# Patient Record
Sex: Female | Born: 1985 | Race: Black or African American | Hispanic: No | Marital: Single | Smoking: Former smoker
Health system: Southern US, Community
[De-identification: ages and names within clinical notes are randomized; demographics above are authoritative.]

## PROBLEM LIST (undated history)

## (undated) DIAGNOSIS — N750 Cyst of Bartholin's gland: Secondary | ICD-10-CM

## (undated) DIAGNOSIS — A599 Trichomoniasis, unspecified: Secondary | ICD-10-CM

## (undated) DIAGNOSIS — B9689 Other specified bacterial agents as the cause of diseases classified elsewhere: Secondary | ICD-10-CM

## (undated) DIAGNOSIS — D759 Disease of blood and blood-forming organs, unspecified: Secondary | ICD-10-CM

## (undated) DIAGNOSIS — J45909 Unspecified asthma, uncomplicated: Secondary | ICD-10-CM

## (undated) DIAGNOSIS — B009 Herpesviral infection, unspecified: Secondary | ICD-10-CM

## (undated) DIAGNOSIS — N760 Acute vaginitis: Secondary | ICD-10-CM

## (undated) DIAGNOSIS — N189 Chronic kidney disease, unspecified: Secondary | ICD-10-CM

## (undated) DIAGNOSIS — J069 Acute upper respiratory infection, unspecified: Secondary | ICD-10-CM

## (undated) DIAGNOSIS — D649 Anemia, unspecified: Secondary | ICD-10-CM

## (undated) HISTORY — DX: Disease of blood and blood-forming organs, unspecified: D75.9

## (undated) HISTORY — PX: INDUCED ABORTION: SHX677

## (undated) HISTORY — DX: Acute upper respiratory infection, unspecified: J06.9

## (undated) HISTORY — DX: Chronic kidney disease, unspecified: N18.9

## (undated) HISTORY — DX: Anemia, unspecified: D64.9

## (undated) HISTORY — PX: NO PAST SURGERIES: SHX2092

---

## 2009-09-22 ENCOUNTER — Ambulatory Visit (HOSPITAL_COMMUNITY): Admission: AD | Admit: 2009-09-22 | Discharge: 2009-09-22 | Payer: Self-pay | Admitting: Obstetrics

## 2009-11-28 ENCOUNTER — Inpatient Hospital Stay (HOSPITAL_COMMUNITY): Admission: AD | Admit: 2009-11-28 | Discharge: 2009-12-01 | Payer: Self-pay | Admitting: Obstetrics

## 2009-11-28 ENCOUNTER — Ambulatory Visit: Payer: Self-pay | Admitting: Obstetrics & Gynecology

## 2010-06-24 LAB — CBC
HCT: 27.8 % — ABNORMAL LOW (ref 36.0–46.0)
Hemoglobin: 9.2 g/dL — ABNORMAL LOW (ref 12.0–15.0)
MCH: 26.7 pg (ref 26.0–34.0)
MCH: 27.8 pg (ref 26.0–34.0)
MCHC: 32 g/dL (ref 30.0–36.0)
MCHC: 33 g/dL (ref 30.0–36.0)
MCV: 83.5 fL (ref 78.0–100.0)
Platelets: 196 10*3/uL (ref 150–400)
RBC: 4.6 MIL/uL (ref 3.87–5.11)
RDW: 15.4 % (ref 11.5–15.5)

## 2010-06-24 LAB — RH IMMUNE GLOB WKUP(>/=20WKS)(NOT WOMEN'S HOSP)

## 2010-06-24 LAB — RPR: RPR Ser Ql: NONREACTIVE

## 2010-06-26 LAB — RH IMMUNE GLOBULIN WORKUP (NOT WOMEN'S HOSP): Antibody Screen: NEGATIVE

## 2011-01-09 ENCOUNTER — Inpatient Hospital Stay (HOSPITAL_COMMUNITY)
Admission: AD | Admit: 2011-01-09 | Discharge: 2011-01-09 | Disposition: A | Discharge status: Home / Self Care | Payer: Self-pay | Source: Ambulatory Visit | Attending: Obstetrics and Gynecology | Admitting: Obstetrics and Gynecology

## 2011-01-09 ENCOUNTER — Inpatient Hospital Stay (HOSPITAL_COMMUNITY): Payer: Self-pay

## 2011-01-09 ENCOUNTER — Encounter (HOSPITAL_COMMUNITY): Payer: Self-pay | Admitting: *Deleted

## 2011-01-09 DIAGNOSIS — O99891 Other specified diseases and conditions complicating pregnancy: Secondary | ICD-10-CM | POA: Insufficient documentation

## 2011-01-09 DIAGNOSIS — J069 Acute upper respiratory infection, unspecified: Secondary | ICD-10-CM | POA: Insufficient documentation

## 2011-01-09 DIAGNOSIS — R062 Wheezing: Secondary | ICD-10-CM | POA: Insufficient documentation

## 2011-01-09 HISTORY — DX: Cyst of Bartholin's gland: N75.0

## 2011-01-09 HISTORY — DX: Trichomoniasis, unspecified: A59.9

## 2011-01-09 LAB — URINALYSIS, ROUTINE W REFLEX MICROSCOPIC
Bilirubin Urine: NEGATIVE
Glucose, UA: NEGATIVE mg/dL
Ketones, ur: 40 mg/dL — AB
Protein, ur: NEGATIVE mg/dL
pH: 6 (ref 5.0–8.0)

## 2011-01-09 LAB — CBC
HCT: 37 % (ref 36.0–46.0)
Hemoglobin: 12.3 g/dL (ref 12.0–15.0)
MCH: 28 pg (ref 26.0–34.0)
MCHC: 33.2 g/dL (ref 30.0–36.0)
MCV: 84.1 fL (ref 78.0–100.0)

## 2011-01-09 LAB — URINE MICROSCOPIC-ADD ON

## 2011-01-09 MED ORDER — ALBUTEROL SULFATE (5 MG/ML) 0.5% IN NEBU
INHALATION_SOLUTION | RESPIRATORY_TRACT | Status: AC
  Filled 2011-01-09: qty 0.5

## 2011-01-09 MED ORDER — ALBUTEROL SULFATE (5 MG/ML) 0.5% IN NEBU: 2.5000 mg | INHALATION_SOLUTION | Freq: Once | RESPIRATORY_TRACT | Status: DC

## 2011-01-09 MED ORDER — IPRATROPIUM BROMIDE 0.02 % IN SOLN
RESPIRATORY_TRACT | Status: AC
  Filled 2011-01-09: qty 2.5

## 2011-01-09 MED ORDER — AZITHROMYCIN 250 MG PO TABS: ORAL_TABLET | ORAL | Status: AC

## 2011-01-09 MED ORDER — IPRATROPIUM BROMIDE 0.02 % IN SOLN
0.5000 mg | Freq: Once | RESPIRATORY_TRACT | Status: DC
Start: 2011-01-09 — End: 2011-01-09

## 2011-01-09 MED ORDER — LEVALBUTEROL TARTRATE 45 MCG/ACT IN AERO: 1.0000 | INHALATION_SPRAY | RESPIRATORY_TRACT | Status: DC | PRN

## 2011-01-09 NOTE — Progress Notes (Signed)
Pt states she started having URI symptoms on Saturday 9-29 and has continued. Has wheezing and shortness of breath. Some nausea and vomiting and a slight cough.

## 2011-01-09 NOTE — ED Provider Notes (Signed)
History   Pt presents today c/o cough, congestion, and wheezing that began on the 29th of this month. She states her sx have progressively worsened and she now finds it difficult to breath. She denies fever, abd pain, vag dc, bleeding, or any other sx at this time.  Chief Complaint  Patient presents with  . URI   HPI  OB History    Grav Para Term Preterm Abortions TAB SAB Ect Mult Living   2 1 1  0 0 0 0 0 0 1      Past Medical History  Diagnosis Date  . Trichomonas   . Bartholin cyst     History reviewed. No pertinent past surgical history.  No family history on file.  History  Substance Use Topics  . Smoking status: Former Smoker    Quit date: 01/08/2009  . Smokeless tobacco: Never Used  . Alcohol Use: No    Allergies: Allergies not on file  No prescriptions prior to admission    Review of Systems  Constitutional: Positive for malaise/fatigue. Negative for fever and chills.  HENT: Positive for congestion and sore throat.   Eyes: Negative for blurred vision.  Respiratory: Positive for cough, sputum production, shortness of breath and wheezing. Negative for hemoptysis.   Cardiovascular: Negative for chest pain and palpitations.  Gastrointestinal: Positive for nausea and vomiting. Negative for abdominal pain, diarrhea and constipation.  Genitourinary: Negative for dysuria, urgency, frequency and hematuria.  Neurological: Positive for weakness. Negative for dizziness and headaches.  Psychiatric/Behavioral: Negative for depression and suicidal ideas.   Physical Exam   Blood pressure 116/74, pulse 120, temperature 99 F (37.2 C), temperature source Oral, resp. rate 20, height 5' (1.524 m), weight 109 lb 9.6 oz (49.714 kg), last menstrual period 10/26/2010, SpO2 98.00%.  Physical Exam  Constitutional: She is oriented to person, place, and time. She appears well-developed and well-nourished. No distress.  HENT:  Head: Normocephalic and atraumatic.  Eyes: EOM are  normal. Pupils are equal, round, and reactive to light.  Cardiovascular: Normal rate, regular rhythm and normal heart sounds.  Exam reveals no gallop and no friction rub.   No murmur heard. Respiratory: Effort normal. No respiratory distress. She has wheezes. She has no rales. She exhibits no tenderness.  GI: Soft. She exhibits no distension. There is no tenderness. There is no rebound and no guarding.  Neurological: She is alert and oriented to person, place, and time.  Skin: Skin is warm and dry. She is not diaphoretic.  Psychiatric: She has a normal mood and affect. Her behavior is normal. Judgment and thought content normal.    MAU Course  Procedures  Nebulizer tx ordered. CXR ordered.  Pt sx much improved following nebulizer tx. No wheezing at this time.  Results for orders placed during the hospital encounter of 01/09/11 (from the past 24 hour(s))  URINALYSIS, ROUTINE W REFLEX MICROSCOPIC     Status: Abnormal   Collection Time   01/09/11  5:05 PM      Component Value Range   Color, Urine YELLOW  YELLOW    Appearance HAZY (*) CLEAR    Specific Gravity, Urine >1.030 (*) 1.005 - 1.030    pH 6.0  5.0 - 8.0    Glucose, UA NEGATIVE  NEGATIVE (mg/dL)   Hgb urine dipstick LARGE (*) NEGATIVE    Bilirubin Urine NEGATIVE  NEGATIVE    Ketones, ur 40 (*) NEGATIVE (mg/dL)   Protein, ur NEGATIVE  NEGATIVE (mg/dL)   Urobilinogen, UA 0.2  0.0 -  1.0 (mg/dL)   Nitrite NEGATIVE  NEGATIVE    Leukocytes, UA NEGATIVE  NEGATIVE   URINE MICROSCOPIC-ADD ON     Status: Abnormal   Collection Time   01/09/11  5:05 PM      Component Value Range   Squamous Epithelial / LPF FEW (*) RARE    WBC, UA 0-2  <3 (WBC/hpf)   RBC / HPF 21-50  <3 (RBC/hpf)   Bacteria, UA FEW (*) RARE    Urine-Other MUCOUS PRESENT    CBC     Status: Normal   Collection Time   01/09/11  5:34 PM      Component Value Range   WBC 9.6  4.0 - 10.5 (K/uL)   RBC 4.40  3.87 - 5.11 (MIL/uL)   Hemoglobin 12.3  12.0 - 15.0 (g/dL)    HCT 14.7  82.9 - 56.2 (%)   MCV 84.1  78.0 - 100.0 (fL)   MCH 28.0  26.0 - 34.0 (pg)   MCHC 33.2  30.0 - 36.0 (g/dL)   RDW 13.0  86.5 - 78.4 (%)   Platelets 248  150 - 400 (K/uL)  POCT PREGNANCY, URINE     Status: Normal   Collection Time   01/09/11  5:40 PM      Component Value Range   Preg Test, Ur POSITIVE     Dg Chest 2 View  01/09/2011  *RADIOLOGY REPORT*  Clinical Data: 25 year old approximately [redacted] weeks pregnant, presenting with wheezing.  CHEST - 2 VIEW 01/09/2011:  Comparison: None.  Findings: Cardiomediastinal silhouette unremarkable.  Lungs clear. Bronchovascular markings normal.  Pulmonary vascularity normal.  No pleural effusions.  No pneumothorax.  Visualized bony thorax intact.  IMPRESSION: Normal examination.  Original Report Authenticated By: Arnell Sieving, M.D.      Assessment and Plan  URI/Wheezing: discussed with pt at length. Will start a Z-pack and give Rx for albuterol inhaler. She will f/u with her OB provider. Discussed diet, activity, risks, and precautions.  Clinton Gallant. Rice III, DrHSc, MPAS, PA-C  01/09/2011, 5:26 PM   Henrietta Hoover, PA 01/09/11 1853

## 2011-01-09 NOTE — ED Notes (Signed)
Respiratory assessment documented WDL @ 1659, clicked in error. See corrected assessment.

## 2011-01-09 NOTE — ED Notes (Signed)
Patient states she may not be able to get meds. until AM due to transportation  isssues. States she just wants to go home and sleep. Encouraged pt. to find a way to get meds. tonight because she may have return of breathing problems that medications would alleviate. Patient states she will try.

## 2011-01-10 NOTE — ED Provider Notes (Signed)
Agree with above note.  Nelta Caudill 01/10/2011 7:44 AM   

## 2011-02-14 ENCOUNTER — Encounter (HOSPITAL_COMMUNITY): Payer: Self-pay | Admitting: *Deleted

## 2011-02-14 ENCOUNTER — Emergency Department (HOSPITAL_COMMUNITY)
Admission: EM | Admit: 2011-02-14 | Discharge: 2011-02-15 | Disposition: A | Discharge status: Home / Self Care | Payer: Self-pay | Attending: Emergency Medicine | Admitting: Emergency Medicine

## 2011-02-14 ENCOUNTER — Emergency Department (HOSPITAL_COMMUNITY): Payer: Self-pay

## 2011-02-14 DIAGNOSIS — A499 Bacterial infection, unspecified: Secondary | ICD-10-CM | POA: Insufficient documentation

## 2011-02-14 DIAGNOSIS — N760 Acute vaginitis: Secondary | ICD-10-CM | POA: Insufficient documentation

## 2011-02-14 DIAGNOSIS — B9689 Other specified bacterial agents as the cause of diseases classified elsewhere: Secondary | ICD-10-CM | POA: Insufficient documentation

## 2011-02-14 DIAGNOSIS — R10819 Abdominal tenderness, unspecified site: Secondary | ICD-10-CM | POA: Insufficient documentation

## 2011-02-14 DIAGNOSIS — R197 Diarrhea, unspecified: Secondary | ICD-10-CM | POA: Insufficient documentation

## 2011-02-14 DIAGNOSIS — R5381 Other malaise: Secondary | ICD-10-CM | POA: Insufficient documentation

## 2011-02-14 DIAGNOSIS — R142 Eructation: Secondary | ICD-10-CM | POA: Insufficient documentation

## 2011-02-14 DIAGNOSIS — R141 Gas pain: Secondary | ICD-10-CM | POA: Insufficient documentation

## 2011-02-14 DIAGNOSIS — R112 Nausea with vomiting, unspecified: Secondary | ICD-10-CM | POA: Insufficient documentation

## 2011-02-14 DIAGNOSIS — R109 Unspecified abdominal pain: Secondary | ICD-10-CM | POA: Insufficient documentation

## 2011-02-14 DIAGNOSIS — R61 Generalized hyperhidrosis: Secondary | ICD-10-CM | POA: Insufficient documentation

## 2011-02-14 DIAGNOSIS — R630 Anorexia: Secondary | ICD-10-CM | POA: Insufficient documentation

## 2011-02-14 DIAGNOSIS — M549 Dorsalgia, unspecified: Secondary | ICD-10-CM | POA: Insufficient documentation

## 2011-02-14 DIAGNOSIS — R509 Fever, unspecified: Secondary | ICD-10-CM | POA: Insufficient documentation

## 2011-02-14 DIAGNOSIS — R231 Pallor: Secondary | ICD-10-CM | POA: Insufficient documentation

## 2011-02-14 LAB — URINALYSIS, ROUTINE W REFLEX MICROSCOPIC
Bilirubin Urine: NEGATIVE
Leukocytes, UA: NEGATIVE
Nitrite: NEGATIVE
Specific Gravity, Urine: 1.022 (ref 1.005–1.030)
Urobilinogen, UA: 1 mg/dL (ref 0.0–1.0)

## 2011-02-14 MED ORDER — ONDANSETRON HCL 4 MG/2ML IJ SOLN
4.0000 mg | Freq: Once | INTRAMUSCULAR | Status: AC
  Administered 2011-02-14: 4 mg via INTRAVENOUS
  Filled 2011-02-14: qty 2

## 2011-02-14 MED ORDER — SODIUM CHLORIDE 0.9 % IV SOLN
999.0000 mL | INTRAVENOUS | Status: DC
  Administered 2011-02-14 (×2): 999 mL via INTRAVENOUS

## 2011-02-14 MED ORDER — HYDROMORPHONE HCL PF 1 MG/ML IJ SOLN
0.5000 mg | Freq: Once | INTRAMUSCULAR | Status: AC
  Administered 2011-02-14: 0.5 mg via INTRAVENOUS
  Filled 2011-02-14: qty 1

## 2011-02-14 NOTE — ED Notes (Signed)
Assumed care on pt. Introduced self , call lioht with reach , waiting for EDP  Evaluation .

## 2011-02-14 NOTE — ED Notes (Signed)
RETURNED FROM ULTRASOUND . NO PIAN OR DISCOMFORT AT THIS TIME ,RESPIRATIONS UNLABORED, IV SITE UNREMARKABLE. NO NAUSEA.

## 2011-02-14 NOTE — ED Notes (Signed)
Lower abd pain and back pain since Sunday night.  Diarrhea also.  lmp oct

## 2011-02-14 NOTE — ED Provider Notes (Signed)
History     CSN: [id number] Arrival date & time: 02/14/2011  7:05 PM   First MD Initiated Contact with Patient 02/14/11 2208      Chief Complaint  Patient presents with  . Abdominal Pain    (Consider location/radiation/quality/duration/timing/severity/associated sxs/prior treatment) Patient is a 25 y.o. female presenting with abdominal pain. The history is provided by the patient.  Abdominal Pain The primary symptoms of the illness include abdominal pain, fever, nausea, vomiting and diarrhea. The primary symptoms of the illness do not include dysuria, vaginal discharge or vaginal bleeding. The current episode started 2 days ago. The onset of the illness was sudden. The problem has been gradually worsening.  The patient states that she believes she is currently not pregnant. Additional symptoms associated with the illness include chills, anorexia and back pain. Symptoms associated with the illness do not include frequency.    Past Medical History  Diagnosis Date  . Trichomonas   . Bartholin cyst     History reviewed. No pertinent past surgical history.  History reviewed. No pertinent family history.  History  Substance Use Topics  . Smoking status: Former Smoker    Quit date: 01/08/2009  . Smokeless tobacco: Never Used  . Alcohol Use: No    OB History    Grav Para Term Preterm Abortions TAB SAB Ect Mult Living   2 1 1  0 0 0 0 0 0 1      Review of Systems  Constitutional: Positive for fever and chills.  Eyes: Negative.   Respiratory: Negative.   Cardiovascular: Negative.   Gastrointestinal: Positive for nausea, vomiting, abdominal pain, diarrhea, abdominal distention and anorexia.  Genitourinary: Negative for dysuria, frequency, vaginal bleeding, vaginal discharge and difficulty urinating.  Musculoskeletal: Positive for back pain.  Skin: Negative.   Neurological: Positive for weakness.    Allergies  Review of patient's allergies indicates no known  allergies.  Home Medications   Current Outpatient Rx  Name Route Sig Dispense Refill  . IBUPROFEN 200 MG PO TABS Oral Take 200 mg by mouth. backache     . LEVALBUTEROL TARTRATE 45 MCG/ACT IN AERO Inhalation Inhale 1-2 puffs into the lungs every 4 (four) hours as needed for wheezing. 1 Inhaler 12  . MULTI-VITAMIN/MINERALS PO TABS Oral Take 1 tablet by mouth daily.        BP 112/75  Pulse 87  Temp(Src) 98.5 F (36.9 C) (Oral)  Resp 16  SpO2 100%  LMP 10/26/2010  Breastfeeding? Unknown  Physical Exam  Constitutional: She appears well-developed and well-nourished.  HENT:  Head: Normocephalic.  Eyes: EOM are normal.  Neck: Normal range of motion.  Cardiovascular: Normal rate.   Pulmonary/Chest: Effort normal.  Abdominal: She exhibits distension. She exhibits no mass. There is tenderness. There is no rebound and no guarding.  Musculoskeletal: Normal range of motion.  Neurological: She is alert.  Skin: Skin is warm. She is diaphoretic. There is pallor.    ED Course  Procedures (including critical care time)  Labs Reviewed  URINALYSIS, ROUTINE W REFLEX MICROSCOPIC - Abnormal; Notable for the following:    Ketones, ur 40 (*)    All other components within normal limits  POCT PREGNANCY, URINE  POCT PREGNANCY, URINE  CBC  DIFFERENTIAL  PROTIME-INR  HCG, QUANTITATIVE, PREGNANCY   No results found.   No diagnosis found.    MDM  therapeutic abortion at 15 weeks October 17 now with 1 days of N/V/D 2 days ago yesterday just nausea, pain states vaginal bleeding stopped  4 days ago Has not had follow up appointment yet  Concerned for tuboovarian abscess        Arman Filter, NP 02/14/11 2241

## 2011-02-15 LAB — CBC
MCV: 86.6 fL (ref 78.0–100.0)
Platelets: 313 10*3/uL (ref 150–400)
RDW: 13.4 % (ref 11.5–15.5)
WBC: 9.8 10*3/uL (ref 4.0–10.5)

## 2011-02-15 LAB — DIFFERENTIAL
Basophils Absolute: 0 10*3/uL (ref 0.0–0.1)
Eosinophils Absolute: 0.1 10*3/uL (ref 0.0–0.7)
Eosinophils Relative: 1 % (ref 0–5)
Lymphocytes Relative: 27 % (ref 12–46)

## 2011-02-15 LAB — PROTIME-INR: Prothrombin Time: 14.9 seconds (ref 11.6–15.2)

## 2011-02-15 LAB — RPR: RPR Ser Ql: NONREACTIVE

## 2011-02-15 MED ORDER — HYDROMORPHONE HCL PF 1 MG/ML IJ SOLN
0.5000 mg | Freq: Once | INTRAMUSCULAR | Status: AC
  Administered 2011-02-15: 0.5 mg via INTRAVENOUS

## 2011-02-15 MED ORDER — HYDROMORPHONE HCL PF 1 MG/ML IJ SOLN
INTRAMUSCULAR | Status: AC
  Filled 2011-02-15: qty 1

## 2011-02-15 MED ORDER — METRONIDAZOLE 500 MG PO TABS
500.0000 mg | ORAL_TABLET | Freq: Once | ORAL | Status: AC
  Administered 2011-02-15: 500 mg via ORAL
  Filled 2011-02-15: qty 1

## 2011-02-15 MED ORDER — METRONIDAZOLE 500 MG PO TABS: 500.0000 mg | ORAL_TABLET | Freq: Two times a day (BID) | ORAL | Status: DC

## 2011-02-15 MED ORDER — OXYCODONE-ACETAMINOPHEN 5-325 MG PO TABS
ORAL_TABLET | ORAL | Status: AC
  Administered 2011-02-15: 1 via ORAL
  Filled 2011-02-15: qty 1

## 2011-02-15 MED ORDER — OXYCODONE-ACETAMINOPHEN 5-325 MG PO TABS: 1.0000 | ORAL_TABLET | ORAL | Status: DC | PRN

## 2011-02-15 NOTE — ED Notes (Signed)
PT. SLEEPING WITH NO PAIN OR DISCOMFORT . IV SITE UNREMARKABLE.

## 2011-02-15 NOTE — ED Provider Notes (Signed)
Medical screening examination/treatment/procedure(s) were performed by non-physician practitioner and as supervising physician I was immediately available for consultation/collaboration.   Glynn Octave, MD 02/15/11 0005

## 2011-02-15 NOTE — ED Notes (Signed)
RESTING WITH NO PAIN OR DISCOMFORT . RESPIRATIONS UNLABORED.  WAITING FOR EDP REEVALUATION. CALL LIGHT WITHIN REACH.

## 2011-02-15 NOTE — ED Notes (Signed)
PELVIC EXAM PERFORMED BY NP WITH [facility].

## 2011-02-16 LAB — GC/CHLAMYDIA PROBE AMP, GENITAL
Chlamydia, DNA Probe: NEGATIVE
GC Probe Amp, Genital: NEGATIVE

## 2011-02-19 ENCOUNTER — Inpatient Hospital Stay (HOSPITAL_COMMUNITY)
Admission: EM | Admit: 2011-02-19 | Discharge: 2011-02-21 | DRG: 694 | Disposition: A | Discharge status: Home / Self Care | Payer: Medicaid Other | Attending: Urology | Admitting: Urology

## 2011-02-19 ENCOUNTER — Encounter (HOSPITAL_COMMUNITY): Payer: Self-pay | Admitting: *Deleted

## 2011-02-19 ENCOUNTER — Other Ambulatory Visit (HOSPITAL_COMMUNITY): Payer: Self-pay

## 2011-02-19 DIAGNOSIS — N201 Calculus of ureter: Secondary | ICD-10-CM

## 2011-02-19 DIAGNOSIS — N390 Urinary tract infection, site not specified: Secondary | ICD-10-CM

## 2011-02-19 DIAGNOSIS — R109 Unspecified abdominal pain: Secondary | ICD-10-CM | POA: Diagnosis present

## 2011-02-19 HISTORY — DX: Acute vaginitis: B96.89

## 2011-02-19 HISTORY — DX: Acute vaginitis: N76.0

## 2011-02-19 LAB — COMPREHENSIVE METABOLIC PANEL
ALT: 8 U/L (ref 0–35)
AST: 17 U/L (ref 0–37)
Albumin: 4.4 g/dL (ref 3.5–5.2)
Alkaline Phosphatase: 73 U/L (ref 39–117)
BUN: 10 mg/dL (ref 6–23)
CO2: 24 mEq/L (ref 19–32)
Calcium: 10 mg/dL (ref 8.4–10.5)
Chloride: 101 mEq/L (ref 96–112)
Creatinine, Ser: 1.19 mg/dL — ABNORMAL HIGH (ref 0.50–1.10)
GFR calc Af Amer: 74 mL/min — ABNORMAL LOW (ref >90)
GFR calc non Af Amer: 64 mL/min — ABNORMAL LOW (ref >90)
Glucose, Bld: 79 mg/dL (ref 70–99)
Potassium: 3.7 mEq/L (ref 3.5–5.1)
Sodium: 138 mEq/L (ref 135–145)
Total Bilirubin: 0.1 mg/dL — ABNORMAL LOW (ref 0.3–1.2)
Total Protein: 8.6 g/dL — ABNORMAL HIGH (ref 6.0–8.3)

## 2011-02-19 LAB — DIFFERENTIAL
Basophils Absolute: 0 10*3/uL (ref 0.0–0.1)
Basophils Relative: 0 % (ref 0–1)
Eosinophils Absolute: 0.3 10*3/uL (ref 0.0–0.7)
Eosinophils Relative: 3 % (ref 0–5)
Lymphocytes Relative: 41 % (ref 12–46)
Lymphs Abs: 3.6 10*3/uL (ref 0.7–4.0)
Monocytes Absolute: 0.8 10*3/uL (ref 0.1–1.0)
Monocytes Relative: 9 % (ref 3–12)
Neutro Abs: 4.2 10*3/uL (ref 1.7–7.7)
Neutrophils Relative %: 48 % (ref 43–77)

## 2011-02-19 LAB — URINALYSIS, ROUTINE W REFLEX MICROSCOPIC
Bilirubin Urine: NEGATIVE
Glucose, UA: NEGATIVE mg/dL
Hgb urine dipstick: NEGATIVE
Nitrite: POSITIVE — AB
Protein, ur: NEGATIVE mg/dL
Specific Gravity, Urine: 1.029 (ref 1.005–1.030)
Urobilinogen, UA: 0.2 mg/dL (ref 0.0–1.0)
pH: 6 (ref 5.0–8.0)

## 2011-02-19 LAB — LIPASE, BLOOD: Lipase: 41 U/L (ref 11–59)

## 2011-02-19 LAB — CBC
HCT: 42.3 % (ref 36.0–46.0)
Hemoglobin: 13.8 g/dL (ref 12.0–15.0)
MCH: 28.5 pg (ref 26.0–34.0)
MCHC: 32.6 g/dL (ref 30.0–36.0)
MCV: 87.2 fL (ref 78.0–100.0)
Platelets: 365 10*3/uL (ref 150–400)
RBC: 4.85 MIL/uL (ref 3.87–5.11)
RDW: 13.3 % (ref 11.5–15.5)
WBC: 8.9 10*3/uL (ref 4.0–10.5)

## 2011-02-19 LAB — URINE MICROSCOPIC-ADD ON

## 2011-02-19 MED ORDER — ONDANSETRON HCL 4 MG/2ML IJ SOLN
4.0000 mg | Freq: Once | INTRAMUSCULAR | Status: AC
  Administered 2011-02-19: 4 mg via INTRAVENOUS
  Filled 2011-02-19: qty 2

## 2011-02-19 MED ORDER — SODIUM CHLORIDE 0.9 % IV BOLUS (SEPSIS)
1000.0000 mL | Freq: Once | INTRAVENOUS | Status: AC
Start: 2011-02-19 — End: 2011-02-19
  Administered 2011-02-19: 1000 mL via INTRAVENOUS

## 2011-02-19 MED ORDER — MORPHINE SULFATE 4 MG/ML IJ SOLN
4.0000 mg | Freq: Once | INTRAMUSCULAR | Status: AC
  Administered 2011-02-19: 4 mg via INTRAVENOUS
  Filled 2011-02-19: qty 1

## 2011-02-19 NOTE — ED Notes (Signed)
Pt bp checked in both arms in triage,

## 2011-02-19 NOTE — ED Notes (Signed)
Pt with upper abd pain and left flank pain, seen at [facility] and treated for BV, pt states same pain has increased today

## 2011-02-19 NOTE — ED Notes (Signed)
Notified physician that pt asking for pain and nausea med.

## 2011-02-19 NOTE — ED Provider Notes (Signed)
History     CSN: [id number] Arrival date & time: 02/19/2011  6:26 PM   First MD Initiated Contact with Patient 02/19/11 1943      Chief Complaint  Patient presents with  . Flank Pain    (Consider location/radiation/quality/duration/timing/severity/associated sxs/prior treatment) HPI Comments: Pt seen and treated for BV 2 days ago and was getting better until today when things got really bad.  Patient is a 25 y.o. female presenting with flank pain and vomiting. The history is provided by the patient.  Flank Pain This is a new problem. The current episode started 1 to 2 hours ago. The problem occurs constantly. The problem has been gradually worsening. Associated symptoms include abdominal pain. Pertinent negatives include no chest pain and no shortness of breath. The symptoms are aggravated by nothing. The symptoms are relieved by nothing. She has tried nothing for the symptoms. The treatment provided no relief.  Emesis  This is a new problem. The current episode started 1 to 2 hours ago. The problem occurs 2 to 4 times per day. The problem has not changed since onset.The emesis has an appearance of stomach contents. There has been no fever. Associated symptoms include abdominal pain. Pertinent negatives include no diarrhea and no fever.    Past Medical History  Diagnosis Date  . Trichomonas   . Bartholin cyst   . Bacterial vaginosis     Past Surgical History  Procedure Date  . Induced abortion     No family history on file.  History  Substance Use Topics  . Smoking status: Former Smoker    Quit date: 01/08/2009  . Smokeless tobacco: Never Used  . Alcohol Use: No    OB History    Grav Para Term Preterm Abortions TAB SAB Ect Mult Living   2 1 1  0 0 0 0 0 0 1      Review of Systems  Constitutional: Negative for fever.  Respiratory: Negative for shortness of breath.   Cardiovascular: Negative for chest pain.  Gastrointestinal: Positive for nausea, vomiting and  abdominal pain. Negative for diarrhea.  Genitourinary: Positive for flank pain. Negative for dysuria, vaginal bleeding, vaginal discharge and difficulty urinating.  All other systems reviewed and are negative.    Allergies  Review of patient's allergies indicates no known allergies.  Home Medications   Current Outpatient Rx  Name Route Sig Dispense Refill  . IBUPROFEN 800 MG PO TABS Oral Take 800 mg by mouth every 6 (six) hours as needed.      Marland Kitchen METRONIDAZOLE 500 MG PO TABS Oral Take 1 tablet (500 mg total) by mouth 2 (two) times daily. 14 tablet 0  . NORETHIN ACE-ETH ESTRAD-FE 1.5-30 MG-MCG PO TABS Oral Take 1 tablet by mouth daily.      . OXYCODONE-ACETAMINOPHEN 5-325 MG PO TABS Oral Take 1 tablet by mouth every 4 (four) hours as needed for pain. 15 tablet 0    BP 133/89  Pulse 83  Temp(Src) 98.9 F (37.2 C) (Oral)  Resp 20  SpO2 100%  LMP 10/26/2010  Breastfeeding? Unknown  Physical Exam  Nursing note and vitals reviewed. Constitutional: She is oriented to person, place, and time. She appears well-developed and well-nourished. She appears distressed.  HENT:  Head: Normocephalic and atraumatic.  Eyes: EOM are normal. Pupils are equal, round, and reactive to light.  Cardiovascular: Normal rate, regular rhythm, normal heart sounds and intact distal pulses.  Exam reveals no friction rub.   No murmur heard. Pulmonary/Chest: Effort normal and  breath sounds normal. She has no wheezes. She has no rales.  Abdominal: Soft. Bowel sounds are normal. She exhibits no distension. There is tenderness in the left upper quadrant and left lower quadrant. There is guarding and CVA tenderness. There is no rebound.  Musculoskeletal: Normal range of motion. She exhibits no tenderness.       No edema  Neurological: She is alert and oriented to person, place, and time. No cranial nerve deficit.  Skin: Skin is warm and dry. No rash noted.  Psychiatric: She has a normal mood and affect. Her  behavior is normal.    ED Course  Procedures (including critical care time)  Labs Reviewed  COMPREHENSIVE METABOLIC PANEL - Abnormal; Notable for the following:    Creatinine, Ser 1.19 (*)    Total Protein 8.6 (*)    Total Bilirubin 0.1 (*)    GFR calc non Af Amer 64 (*)    GFR calc Af Amer 74 (*)    All other components within normal limits  URINALYSIS, ROUTINE W REFLEX MICROSCOPIC - Abnormal; Notable for the following:    Color, Urine AMBER (*) BIOCHEMICALS MAY BE AFFECTED BY COLOR   Ketones, ur TRACE (*)    Nitrite POSITIVE (*)    Leukocytes, UA SMALL (*)    All other components within normal limits  URINE MICROSCOPIC-ADD ON - Abnormal; Notable for the following:    Bacteria, UA FEW (*)    All other components within normal limits  CBC  DIFFERENTIAL  LIPASE, BLOOD   No results found.   No diagnosis found.    MDM   Pt with L abd pain and N/V.  States severe pain started today.  Mostly abd pain but maybe some mild flank pain.  Denies hx of kidney stone and no blood in urine suggestive of stone.  Pt seen 2 days ago and had normal U/s of the ovaries and dx with BV and had been on flagyl and pain started today.  Pt is uncomfortable and HTN here.  No prior hx of HTN but very uncomfortable here.  CBC, CMP, lipase all wnl.  UA with signs of UTI.  Will get CT to further eval for cause of pain.  12:22 AM Pt checked out to Dr. Effie Shy at [address], MD 02/20/11 [phone]

## 2011-02-20 ENCOUNTER — Encounter (HOSPITAL_COMMUNITY): Payer: Self-pay

## 2011-02-20 ENCOUNTER — Emergency Department (HOSPITAL_COMMUNITY): Payer: Medicaid Other

## 2011-02-20 MED ORDER — IOHEXOL 300 MG/ML  SOLN
100.0000 mL | Freq: Once | INTRAMUSCULAR | Status: AC | PRN
  Administered 2011-02-20: 100 mL via INTRAVENOUS

## 2011-02-20 MED ORDER — CIPROFLOXACIN IN D5W 400 MG/200ML IV SOLN
400.0000 mg | Freq: Two times a day (BID) | INTRAVENOUS | Status: DC
  Administered 2011-02-20 – 2011-02-21 (×2): 400 mg via INTRAVENOUS
  Filled 2011-02-20 (×4): qty 200

## 2011-02-20 MED ORDER — MORPHINE SULFATE 2 MG/ML IJ SOLN
2.0000 mg | INTRAMUSCULAR | Status: DC | PRN
  Administered 2011-02-20: 2 mg via INTRAVENOUS
  Filled 2011-02-20 (×2): qty 1

## 2011-02-20 MED ORDER — HYDROMORPHONE HCL PF 1 MG/ML IJ SOLN
1.0000 mg | Freq: Once | INTRAMUSCULAR | Status: AC
  Administered 2011-02-20: 1 mg via INTRAVENOUS
  Filled 2011-02-20: qty 1

## 2011-02-20 MED ORDER — ONDANSETRON HCL 4 MG/2ML IJ SOLN
4.0000 mg | Freq: Once | INTRAMUSCULAR | Status: AC
  Administered 2011-02-20: 4 mg via INTRAVENOUS
  Filled 2011-02-20: qty 2

## 2011-02-20 MED ORDER — IOHEXOL 300 MG/ML  SOLN: 100.0000 mL | Freq: Once | INTRAMUSCULAR | Status: AC | PRN

## 2011-02-20 MED ORDER — CIPROFLOXACIN IN D5W 400 MG/200ML IV SOLN
400.0000 mg | Freq: Once | INTRAVENOUS | Status: AC
  Administered 2011-02-20: 400 mg via INTRAVENOUS
  Filled 2011-02-20: qty 200

## 2011-02-20 MED ORDER — MORPHINE SULFATE 4 MG/ML IJ SOLN
4.0000 mg | Freq: Once | INTRAMUSCULAR | Status: AC
  Administered 2011-02-20: 4 mg via INTRAVENOUS
  Filled 2011-02-20: qty 1

## 2011-02-20 MED ORDER — DEXTROSE-NACL 5-0.45 % IV SOLN
INTRAVENOUS | Status: DC
  Administered 2011-02-20 – 2011-02-21 (×2): via INTRAVENOUS

## 2011-02-20 NOTE — ED Notes (Signed)
Pt a/o x 4, resting quietly, skin warm and dry, respirations even and unlabored, no acute distress noted, no needs identified at this time, awaiting bed placement. Pt given ice chips, tolerating without difficulty. Per OR, not on their schedule today

## 2011-02-20 NOTE — Plan of Care (Signed)
Problem: Phase I Progression Outcomes Goal: Initial discharge plan identified Outcome: Completed/Met Date Met:  02/20/11 Plans to go home

## 2011-02-20 NOTE — ED Notes (Signed)
Pt a/o x 4, resting quietly, skin warm and dry, respirations even and unlabored, no acute distress noted, no needs identified at this time, awaiting bed placement

## 2011-02-20 NOTE — ED Notes (Signed)
Pt sleeping, appears to be resting comfortably, skin warm and dry, respirations even and unlabored, no acute distress noted, awaiting bed placement, no needs identified at this time  

## 2011-02-20 NOTE — Discharge Planning (Signed)
ED CM noted no pcp, self pay [place] pt.  She confirms no pcp other than [facility] and self pay.  Report her 23 yr old daughter has medicaid but she does not. CM review and provided written information about local medicaid and self pay pcps choices available, DSS, health dept, financial assistance, low cost medications, housing and how to obtain a pcp.  Her mother works at [employer] will be able to assist with medication costs. Discussed may be followed by a specialist and may need a pcp follow up.  Pt to review sheet and decide on pcp.  Reviewed how speak with [facility] financial counselor & how to contact billing depts at [facility] offices for assistance as needed.  Answered questions about possible LOS, wait time Encouraged her to contact her supervisor at her job to notify of her hospitalization.  Voiced understanding. Provided ED CM contact information as needed

## 2011-02-20 NOTE — ED Notes (Signed)
Pt a/o x 4, resting quietly, skin warm and dry, respirations even and unlabored, no acute distress noted, no needs identified at this time, awaiting bed placement. Denies pain at present

## 2011-02-20 NOTE — ED Notes (Signed)
Pt sleeping, appears to be resting comfortably, skin warm and dry, respirations even and unlabored, no acute distress noted, awaiting bed placement, no needs identified at this time

## 2011-02-20 NOTE — H&P (Signed)
H&P  Chief Complaint: Left ureteral stone  History of Present Illness: Elizabeth [name] is a 25 y.o. with 1 week of severe intermittent left flank pain. She has associate nausea but no fever or hematuria. She has no prior episode of stones. Her pain in uncontrolled.  Past Medical History  Diagnosis Date  . Trichomonas   . Bartholin cyst   . Bacterial vaginosis     Past Surgical History  Procedure Date  . Induced abortion     Home Medications:  Medications Prior to Admission  Medication Dose Route Frequency Provider Last Rate Last Dose  . ciprofloxacin (CIPRO) IVPB 400 mg  400 mg Intravenous Once Elliott L Wentz, MD   400 mg at 02/20/11 0356  . HYDROmorphone (DILAUDID) injection 1 mg  1 mg Intravenous Once Elliott L Wentz, MD   1 mg at 02/20/11 0715  . iohexol (OMNIPAQUE) 300 MG/ML injection 100 mL  100 mL Intravenous Once PRN Medication Radiologist   100 mL at 02/20/11 0213  . iohexol (OMNIPAQUE) 300 MG/ML injection 100 mL  100 mL Intravenous Once PRN Medication Radiologist      . morphine 4 MG/ML injection 4 mg  4 mg Intravenous Once Whitney Plunkett, MD   4 mg at 02/19/11 2104  . morphine 4 MG/ML injection 4 mg  4 mg Intravenous Once Elliott L Wentz, MD   4 mg at 02/20/11 0345  . ondansetron (ZOFRAN) injection 4 mg  4 mg Intravenous Once Whitney Plunkett, MD   4 mg at 02/19/11 2059  . ondansetron (ZOFRAN) injection 4 mg  4 mg Intravenous Once Elliott L Wentz, MD   4 mg at 02/20/11 0411  . sodium chloride 0.9 % bolus 1,000 mL  1,000 mL Intravenous Once Whitney Plunkett, MD   1,000 mL at 02/19/11 2059   No current outpatient prescriptions on file as of 02/19/2011.    Allergies: No Known Allergies  No family history on file. Paternal history of kidney stones.  Social History:  reports that she quit smoking about 2 years ago. She has never used smokeless tobacco. She reports that she does not drink alcohol or use illicit drugs.  ROS: A complete review of systems was performed.   All systems are negative except for pertinent findings as noted.  Physical Exam:  Vital signs in last 24 hours: Temp:  [97.5 F (36.4 C)-99.2 F (37.3 C)] 98.4 F (36.9 C) (11/12 0348) Pulse Rate:  [69-86] 69  (11/12 0348) Resp:  [18-20] 18  (11/12 0033) BP: (113-204)/(64-153) 114/82 mmHg (11/12 0348) SpO2:  [100 %] 100 % (11/12 0348) General:  Alert and oriented, No acute distress HEENT: Normocephalic, atraumatic Neck: No JVD or lymphadenopathy Cardiovascular: Regular rate and rhythm Lungs: Clear bilaterally Abdomen: Soft, nontender, nondistended, no abdominal masses Back: No CVA tenderness Extremities: No edema Neurologic: Grossly intact  Laboratory Data:  Results for orders placed during the hospital encounter of 02/19/11 (from the past 24 hour(s))  URINALYSIS, ROUTINE W REFLEX MICROSCOPIC     Status: Abnormal   Collection Time   02/19/11  8:36 PM      Component Value Range   Color, Urine AMBER (*) YELLOW    Appearance CLEAR  CLEAR    Specific Gravity, Urine 1.029  1.005 - 1.030    pH 6.0  5.0 - 8.0    Glucose, UA NEGATIVE  NEGATIVE (mg/dL)   Hgb urine dipstick NEGATIVE  NEGATIVE    Bilirubin Urine NEGATIVE  NEGATIVE    Ketones, ur TRACE (*)   NEGATIVE (mg/dL)   Protein, ur NEGATIVE  NEGATIVE (mg/dL)   Urobilinogen, UA 0.2  0.0 - 1.0 (mg/dL)   Nitrite POSITIVE (*) NEGATIVE    Leukocytes, UA SMALL (*) NEGATIVE   URINE MICROSCOPIC-ADD ON     Status: Abnormal   Collection Time   02/19/11  8:36 PM      Component Value Range   Squamous Epithelial / LPF RARE  RARE    WBC, UA 3-6  <3 (WBC/hpf)   RBC / HPF 3-6  <3 (RBC/hpf)   Bacteria, UA FEW (*) RARE    Urine-Other MUCOUS PRESENT    CBC     Status: Normal   Collection Time   02/19/11  9:06 PM      Component Value Range   WBC 8.9  4.0 - 10.5 (K/uL)   RBC 4.85  3.87 - 5.11 (MIL/uL)   Hemoglobin 13.8  12.0 - 15.0 (g/dL)   HCT 42.3  36.0 - 46.0 (%)   MCV 87.2  78.0 - 100.0 (fL)   MCH 28.5  26.0 - 34.0 (pg)   MCHC  32.6  30.0 - 36.0 (g/dL)   RDW 13.3  11.5 - 15.5 (%)   Platelets 365  150 - 400 (K/uL)  DIFFERENTIAL     Status: Normal   Collection Time   02/19/11  9:06 PM      Component Value Range   Neutrophils Relative 48  43 - 77 (%)   Neutro Abs 4.2  1.7 - 7.7 (K/uL)   Lymphocytes Relative 41  12 - 46 (%)   Lymphs Abs 3.6  0.7 - 4.0 (K/uL)   Monocytes Relative 9  3 - 12 (%)   Monocytes Absolute 0.8  0.1 - 1.0 (K/uL)   Eosinophils Relative 3  0 - 5 (%)   Eosinophils Absolute 0.3  0.0 - 0.7 (K/uL)   Basophils Relative 0  0 - 1 (%)   Basophils Absolute 0.0  0.0 - 0.1 (K/uL)  COMPREHENSIVE METABOLIC PANEL     Status: Abnormal   Collection Time   02/19/11  9:06 PM      Component Value Range   Sodium 138  135 - 145 (mEq/L)   Potassium 3.7  3.5 - 5.1 (mEq/L)   Chloride 101  96 - 112 (mEq/L)   CO2 24  19 - 32 (mEq/L)   Glucose, Bld 79  70 - 99 (mg/dL)   BUN 10  6 - 23 (mg/dL)   Creatinine, Ser 1.19 (*) 0.50 - 1.10 (mg/dL)   Calcium 10.0  8.4 - 10.5 (mg/dL)   Total Protein 8.6 (*) 6.0 - 8.3 (g/dL)   Albumin 4.4  3.5 - 5.2 (g/dL)   AST 17  0 - 37 (U/L)   ALT 8  0 - 35 (U/L)   Alkaline Phosphatase 73  39 - 117 (U/L)   Total Bilirubin 0.1 (*) 0.3 - 1.2 (mg/dL)   GFR calc non Af Amer 64 (*) >90 (mL/min)   GFR calc Af Amer 74 (*) >90 (mL/min)  LIPASE, BLOOD     Status: Normal   Collection Time   02/19/11  9:06 PM      Component Value Range   Lipase 41  11 - 59 (U/L)   Recent Results (from the past 240 hour(s))  WET PREP, GENITAL     Status: Abnormal   Collection Time   02/15/11  4:01 AM      Component Value Range Status Comment   Yeast, Wet Prep NONE   SEEN  NONE SEEN  Final    Trich, Wet Prep NONE SEEN  NONE SEEN  Final    Clue Cells, Wet Prep MODERATE (*) NONE SEEN  Final    WBC, Wet Prep HPF POC FEW (*) NONE SEEN  Final    Creatinine:  Basename 02/19/11 2106  CREATININE 1.19*    Radiologic Imaging: Ct Abdomen Pelvis W Contrast  02/20/2011  *RADIOLOGY REPORT*  Clinical Data:  Left flank and upper abdominal pain, and nausea and vomiting.  Red blood cells and white blood cells in the urine. Recently treated for bacterial vaginosis.  CT ABDOMEN AND PELVIS WITH CONTRAST  Technique:  Multidetector CT imaging of the abdomen and pelvis was performed following the standard protocol during bolus administration of intravenous contrast.  Contrast:  100 mL of Omnipaque 300 IV contrast  Comparison: Pelvic ultrasound performed 02/14/2011  Findings: The visualized lung bases are clear.  The liver and spleen are unremarkable in appearance.  The gallbladder is within normal limits.  The pancreas and adrenal glands are unremarkable.  There is mild to moderate left-sided hydronephrosis, with prominence of the left ureter to the level of an obstructing 6 x 5 mm stone at the distal left ureter, just above the left vesicoureteral junction.  There is diffusely decreased enhancement of the left kidney, with left renal enlargement, likely reflecting left-sided pyelonephritis.  There is also thickening of the wall of the left ureter, likely reflecting ureteritis.  No nonobstructing renal stones are identified.  No significant perinephric stranding is seen.  No free fluid is identified.  The small bowel is unremarkable in appearance.  The stomach is within normal limits.  No acute vascular abnormalities are seen.  The appendix is normal in caliber and contains contrast, without evidence of appendicitis.  The colon is also filled with contrast and is unremarkable in appearance.  The bladder is mildly distended; the mildly thick-walled appearance of the bladder may reflect cystitis.  There is mild prominence and increased enhancement of the uterus; this may reflect sequelae of the bacterial vaginosis as clinically described.  The ovaries are relatively symmetric; no suspicious adnexal masses are seen.  No inguinal lymphadenopathy is seen.  No acute osseous abnormalities are identified.  IMPRESSION:  1.  Mild to  moderate left-sided hydronephrosis, with an obstructing 6 x 5 mm stone at the distal left ureter, just above the left vesicoureteral junction. 2.  Diffusely decreased enhancement of the left kidney, with left renal enlargement, likely reflecting left-sided pyelonephritis. Wall thickening along the left ureter likely reflects ureteritis. Mildly thick-walled appearance of the bladder may reflect cystitis. 3.  Increased enhancement of the uterus, with mild prominence of the uterine wall and cervix; this may reflect sequelae of the clinically described bacterial vaginosis.  Original Report Authenticated By: JEFFREY CHANG, M.D.    Impression/Assessment:  6 mm left ureteral stone  Plan:  Admit for IVF hydration and IV pain control.  Fidela Cieslak,LES 02/20/2011, 7:55 AM  Diondra Pines S. Dyshaun Bonzo, Jr. MD      

## 2011-02-20 NOTE — ED Provider Notes (Signed)
Report received from radiologist, CT scan abdomen, pelvis, there is left-sided hydronephrosis with a 6 x 5 mm distal left ureteral stone at the junction with the bladder. There is a suggestion of pyelonephritis.  The uterus was also felt to be somewhat prominent.  At this time, 3:32 AM The patient continues to have left lower quadrant, radiating to left lower back pain. She appears mildly toxic. Last documented blood pressure is very high.   At this time. The patient still uncontrolled. Left lower quadrant pain radiating to the left flank. Repeat medication was ordered for pain. I consulted Dr. Laverle Patter from urology. He came to the emergency department, saw the patient and will admit for pain control and management. 7:45 AM  Flint Melter, MD 02/20/11 [phone]

## 2011-02-21 ENCOUNTER — Other Ambulatory Visit: Payer: Self-pay | Admitting: Urology

## 2011-02-21 ENCOUNTER — Inpatient Hospital Stay (HOSPITAL_COMMUNITY): Payer: Medicaid Other

## 2011-02-21 LAB — URINE CULTURE
Colony Count: NO GROWTH
Culture: NO GROWTH

## 2011-02-21 MED ORDER — CIPROFLOXACIN HCL 500 MG PO TABS
500.0000 mg | ORAL_TABLET | Freq: Two times a day (BID) | ORAL | Status: DC
  Administered 2011-02-21: 500 mg via ORAL
  Filled 2011-02-21 (×2): qty 1

## 2011-02-21 MED ORDER — PROMETHAZINE HCL 12.5 MG PO TABS: 12.5000 mg | ORAL_TABLET | Freq: Four times a day (QID) | ORAL | Status: DC | PRN

## 2011-02-21 MED ORDER — CIPROFLOXACIN HCL 500 MG PO TABS: 500.0000 mg | ORAL_TABLET | Freq: Two times a day (BID) | ORAL | Status: DC

## 2011-02-21 MED ORDER — OXYCODONE-ACETAMINOPHEN 5-325 MG PO TABS: 1.0000 | ORAL_TABLET | ORAL | Status: AC | PRN

## 2011-02-21 MED ORDER — TAMSULOSIN HCL 0.4 MG PO CAPS: 0.4000 mg | ORAL_CAPSULE | Freq: Every day | ORAL | Status: DC

## 2011-02-21 NOTE — Progress Notes (Signed)
  Post-op note  Subjective: The patient is doing well. Pain has been well controlled.  No complaints.  Objective: Vital signs in last 24 hours: Temp:  [98 F (36.7 C)-98.9 F (37.2 C)] 98.5 F (36.9 C) (11/13 0455) Pulse Rate:  [58-73] 67  (11/13 0455) Resp:  [16-20] 16  (11/13 0455) BP: (86-103)/(53-70) 90/60 mmHg (11/13 0455) SpO2:  [94 %-100 %] 98 % (11/13 0455) Weight:  [50 kg (110 lb 3.7 oz)] 110 lb 3.7 oz (50 kg) (11/12 1803)  Intake/Output from previous day: 11/12 0701 - 11/13 0700 In: 1481.7 [I.V.:1281.7; IV Piggyback:200] Out: 1300 [Urine:1300] Intake/Output this shift:    Physical Exam:  General: Alert and oriented. Abdomen: Soft, Nondistended. No CVAT.   Lab Results:  Basename 02/19/11 2106  HGB 13.8  HCT 42.3    Assessment/Plan: Left ureteral stone  Discussed options with patient.  Will get a KUB to see if she is a candidate for ESWL.  Will then D/C home with plans to continue medical expulsion therapy but with plans to proceed with ESWL or ureteroscopic laser lithotripsy if she does not pass her stone.  The risks and complications and pros and cons of each option has been discussed in detail.   Rolly Salter, Montez Hageman. MD   LOS: 2 days   Murrell Elizondo,LES 02/21/2011, 7:12 AM

## 2011-02-21 NOTE — Discharge Summary (Signed)
  Date of admission: 02/20/11  Date of discharge: 02/21/2011  Admission diagnosis: Left ureteral stone   Discharge diagnosis: Left ureteral stone  Secondary diagnoses:   History and Physical: For full details, please see admission history and physical. Briefly, Elizabeth [name] is a 25 y.o. year old patient with a 6 mm left ureteral stone.   Hospital Course: She had uncontrolled pain due to her stone and was admitted for IV pain control.  He pain improved the following day and she was felt stable for discharge home.  Laboratory values:  Basename 02/19/11 2106  HGB 13.8  HCT 42.3    Basename 02/19/11 2106  CREATININE 1.19*    Disposition: Home  Discharge instruction: The patient was instructed to resume normal activity and diet as tolerated. Other instructions per discharge instructions in AVS.  Discharge medications:  Medication List  As of 02/21/2011  7:20 AM   START taking these medications         ciprofloxacin 500 MG tablet   Commonly known as: CIPRO   Take 1 tablet (500 mg total) by mouth 2 (two) times daily.         CONTINUE taking these medications         oxyCODONE-acetaminophen 5-325 MG per tablet   Commonly known as: PERCOCET   Take 1 tablet by mouth every 4 (four) hours as needed for pain.         STOP taking these medications         ibuprofen 800 MG tablet      LOESTRIN FE 1.5/30 1.5-30 MG-MCG tablet      metroNIDAZOLE 500 MG tablet          Where to get your medications    These are the prescriptions that you need to pick up.   You may get these medications from any pharmacy.         ciprofloxacin 500 MG tablet   oxyCODONE-acetaminophen 5-325 MG per tablet            Followup:  Follow-up Information    Please follow up. (will call to arrange)

## 2011-02-21 NOTE — Progress Notes (Signed)
Urine collected and strained throughout night, no presence of stones. Will continue to monitor.  Daphine Deutscher, [place]

## 2011-02-22 ENCOUNTER — Encounter (HOSPITAL_COMMUNITY): Payer: Self-pay

## 2011-02-22 ENCOUNTER — Encounter (HOSPITAL_COMMUNITY): Payer: Medicaid Other | Attending: Urology

## 2011-02-22 DIAGNOSIS — Z01818 Encounter for other preprocedural examination: Secondary | ICD-10-CM | POA: Insufficient documentation

## 2011-02-22 DIAGNOSIS — Z01812 Encounter for preprocedural laboratory examination: Secondary | ICD-10-CM | POA: Insufficient documentation

## 2011-02-22 LAB — HCG, SERUM, QUALITATIVE: Preg, Serum: NEGATIVE

## 2011-02-22 NOTE — Pre-Procedure Instructions (Signed)
11/12 /12 Ct abd pelvis in EPIC

## 2011-02-22 NOTE — Pre-Procedure Instructions (Signed)
02/22/11 Renotified Yates Decamp ( [facility] ) that orders have not appeared under signed and held orders for pt 's surgery on 02/23/11.

## 2011-02-22 NOTE — Pre-Procedure Instructions (Signed)
02/22/11 Labs of CBC with Diff and CMET done 02/19/11 on chart.

## 2011-02-22 NOTE — Patient Instructions (Addendum)
22 Elizabeth [name]  02/22/2011   Your procedure is scheduled on:  02/23/11  445-545 pm   Report to [facility] at  245 pm    Call this number if you have problems the morning of surgery: [phone]   Remember: You may have clear liquids from 12 midnite tonite until 1030 am then npo after that.     Do not eat food:After Midnight.  Do not drink clear liquids: After Midnight.  Take these medicines the morning of surgery with A SIP OF WATER:   Do not wear jewelry, make-up or nail polish.  Do not wear lotions, powders, or perfumes.   Do not shave 48 hours prior to surgery.  Do not bring valuables to the hospital.  Contacts, dentures or bridgework may not be worn into surgery.  Patients discharged the day of surgery will not be allowed to drive home.  Name and phone number of your driver: Sunday Shams boyfriend  [phone]   Special Instructions: CHG Shower Use Special Wash: 1/2 bottle night before surgery and 1/2 bottle morning of surgery. Shower chin to toes with CHG. Wash face and private parts with regular soap.   Please read over the following fact sheets that you were given: MRSA Information, coughing and deep breathing exercises and leg exercises.

## 2011-02-23 ENCOUNTER — Encounter (HOSPITAL_COMMUNITY): Payer: Self-pay | Admitting: Anesthesiology

## 2011-02-23 ENCOUNTER — Ambulatory Visit (HOSPITAL_COMMUNITY)
Admission: RE | Admit: 2011-02-23 | Discharge: 2011-02-23 | Disposition: A | Discharge status: Home / Self Care | Payer: Medicaid Other | Source: Ambulatory Visit | Attending: Urology | Admitting: Urology

## 2011-02-23 ENCOUNTER — Encounter (HOSPITAL_COMMUNITY)
Admission: RE | Disposition: A | Discharge status: Home / Self Care | Payer: Self-pay | Source: Ambulatory Visit | Attending: Urology

## 2011-02-23 ENCOUNTER — Encounter (HOSPITAL_COMMUNITY): Payer: Self-pay | Admitting: *Deleted

## 2011-02-23 DIAGNOSIS — N201 Calculus of ureter: Secondary | ICD-10-CM

## 2011-02-23 HISTORY — PX: CYSTOSCOPY/RETROGRADE/URETEROSCOPY: SHX5316

## 2011-02-23 SURGERY — CYSTOSCOPY/RETROGRADE/URETEROSCOPY
Anesthesia: General | Site: Ureter | Laterality: Left | Wound class: Clean Contaminated

## 2011-02-23 MED ORDER — KETOROLAC TROMETHAMINE 30 MG/ML IJ SOLN: 15.0000 mg | Freq: Once | INTRAMUSCULAR | Status: DC | PRN

## 2011-02-23 MED ORDER — MIDAZOLAM HCL 5 MG/5ML IJ SOLN
INTRAMUSCULAR | Status: DC | PRN
  Administered 2011-02-23: 2 mg via INTRAVENOUS

## 2011-02-23 MED ORDER — IOHEXOL 300 MG/ML  SOLN
INTRAMUSCULAR | Status: DC | PRN
  Administered 2011-02-23: 50 mL

## 2011-02-23 MED ORDER — LACTATED RINGERS IV SOLN
INTRAVENOUS | Status: DC
  Administered 2011-02-23: 1000 mL via INTRAVENOUS

## 2011-02-23 MED ORDER — CIPROFLOXACIN IN D5W 400 MG/200ML IV SOLN
400.0000 mg | Freq: Two times a day (BID) | INTRAVENOUS | Status: DC
  Administered 2011-02-23: 400 mg via INTRAVENOUS
  Filled 2011-02-23 (×2): qty 200

## 2011-02-23 MED ORDER — FENTANYL CITRATE 0.05 MG/ML IJ SOLN: 25.0000 ug | INTRAMUSCULAR | Status: DC | PRN

## 2011-02-23 MED ORDER — ONDANSETRON HCL 4 MG/2ML IJ SOLN
INTRAMUSCULAR | Status: DC | PRN
  Administered 2011-02-23: 4 mg via INTRAVENOUS

## 2011-02-23 MED ORDER — DEXAMETHASONE SODIUM PHOSPHATE 10 MG/ML IJ SOLN
INTRAMUSCULAR | Status: DC | PRN
  Administered 2011-02-23: 10 mg via INTRAVENOUS

## 2011-02-23 MED ORDER — OXYCODONE-ACETAMINOPHEN 5-325 MG PO TABS: 1.0000 | ORAL_TABLET | ORAL | Status: AC | PRN

## 2011-02-23 MED ORDER — SODIUM CHLORIDE 0.9 % IR SOLN
Status: DC | PRN
  Administered 2011-02-23: 3000 mL

## 2011-02-23 MED ORDER — PROPOFOL 10 MG/ML IV EMUL
INTRAVENOUS | Status: DC | PRN
  Administered 2011-02-23: 120 mg via INTRAVENOUS

## 2011-02-23 MED ORDER — FENTANYL CITRATE 0.05 MG/ML IJ SOLN
INTRAMUSCULAR | Status: DC | PRN
  Administered 2011-02-23: 100 ug via INTRAVENOUS

## 2011-02-23 MED ORDER — SODIUM CHLORIDE 0.9 % IR SOLN
Status: DC | PRN
  Administered 2011-02-23: 1000 mL

## 2011-02-23 MED ORDER — CIPROFLOXACIN HCL 500 MG PO TABS: 500.0000 mg | ORAL_TABLET | Freq: Two times a day (BID) | ORAL | Status: AC

## 2011-02-23 SURGICAL SUPPLY — 20 items
ADAPTER CATH URET PLST 4-6FR (CATHETERS) ×3 IMPLANT
BAG URO CATCHER STRL LF (DRAPE) ×3 IMPLANT
BASKET STONE NITINOL 3FR/115 (UROLOGICAL SUPPLIES) ×3 IMPLANT
BRIEF STRETCH FOR OB PAD LRG (UNDERPADS AND DIAPERS) ×3 IMPLANT
CATH INTERMIT  6FR 70CM (CATHETERS) ×3 IMPLANT
CLOTH BEACON ORANGE TIMEOUT ST (SAFETY) ×3 IMPLANT
DRAPE CAMERA CLOSED 9X96 (DRAPES) ×3 IMPLANT
GLOVE BIOGEL M STRL SZ7.5 (GLOVE) ×6 IMPLANT
GOWN STRL NON-REIN LRG LVL3 (GOWN DISPOSABLE) ×6 IMPLANT
GUIDEWIRE STR DUAL SENSOR (WIRE) ×6 IMPLANT
IV NS 1000ML (IV SOLUTION) ×1
IV NS 1000ML BAXH (IV SOLUTION) ×2 IMPLANT
IV NS IRRIG 3000ML ARTHROMATIC (IV SOLUTION) ×3 IMPLANT
LASER FIBER DISP (UROLOGICAL SUPPLIES) ×3 IMPLANT
MANIFOLD NEPTUNE II (INSTRUMENTS) ×3 IMPLANT
NS IRRIG 1000ML POUR BTL (IV SOLUTION) ×3 IMPLANT
PACK CYSTO (CUSTOM PROCEDURE TRAY) ×3 IMPLANT
PAD OB MATERNITY 4.3X12.25 (PERSONAL CARE ITEMS) ×3 IMPLANT
TUBING CONNECTING 10 (TUBING) ×3 IMPLANT
WIRE COONS/BENSON .038X145CM (WIRE) IMPLANT

## 2011-02-23 NOTE — Anesthesia Preprocedure Evaluation (Signed)
Anesthesia Evaluation  Patient identified by MRN, date of birth, ID band Patient awake    Reviewed: Allergy & Precautions, H&P , NPO status , Patient's Chart, lab work & pertinent test results  Airway Mallampati: II  Neck ROM: Full    Dental  (+) Teeth Intact   Pulmonary neg pulmonary ROS,    Pulmonary exam normal       Cardiovascular neg cardio ROS     Neuro/Psych Negative Neurological ROS  Negative Psych ROS   GI/Hepatic negative GI ROS, Neg liver ROS,   Endo/Other  Negative Endocrine ROS  Renal/GU negative Renal ROS  Genitourinary negative   Musculoskeletal negative musculoskeletal ROS (+)   Abdominal Normal abdominal exam  (+)   Peds negative pediatric ROS (+)  Hematology negative hematology ROS (+)   Anesthesia Other Findings   Reproductive/Obstetrics negative OB ROS                           Anesthesia Physical Anesthesia Plan  ASA: I  Anesthesia Plan:    Post-op Pain Management:    Induction:   Airway Management Planned: LMA  Additional Equipment:   Intra-op Plan:   Post-operative Plan:   Informed Consent: I have reviewed the patients History and Physical, chart, labs and discussed the procedure including the risks, benefits and alternatives for the proposed anesthesia with the patient or authorized representative who has indicated his/her understanding and acceptance.   Dental advisory given  Plan Discussed with:   Anesthesia Plan Comments:         Anesthesia Quick Evaluation

## 2011-02-23 NOTE — Op Note (Signed)
Preoperative diagnosis: Left ureteral calculus  Postoperative diagnosis: Left ureteral calculus  Procedure:  1. Cystoscopy 2. Left ureteroscopy and stone removal 3. Ureteroscopic laser lithotripsy 4. Left ureteral stent placement (6 x 24)  5. Left retrograde pyelography with interpretation  Surgeon: Moody Bruins. M.D.  Anesthesia: General  Complications: None  Intraoperative findings: Left retrograde pyelography demonstrated a filling defect within the left ureter consistent with the patient's known calculus without other abnormalities.  EBL: Minimal  Specimens: 1. Left ureteral calculus  Disposition of specimens: [facility] for stone analysis  Indication: Elizabeth [name] is a 25 y.o. year old patient with urolithiasis. After reviewing the management options for treatment, the patient elected to proceed with the above surgical procedure(s). We have discussed the potential benefits and risks of the procedure, side effects of the proposed treatment, the likelihood of the patient achieving the goals of the procedure, and any potential problems that might occur during the procedure or recuperation. Informed consent has been obtained.  Description of procedure:  The patient was taken to the operating room and general anesthesia was induced.  The patient was placed in the dorsal lithotomy position, prepped and draped in the usual sterile fashion, and preoperative antibiotics were administered. A preoperative time-out was performed.   Cystourethroscopy was performed.  The patient's urethra was examined and was normal. The bladder was then systematically examined in its entirety. There was no evidence for any bladder tumors, stones, or other mucosal pathology.    Attention then turned to the left ureteral orifice and a ureteral catheter was used to intubate the ureteral orifice.  Omnipaque contrast was injected through the ureteral catheter and a retrograde  pyelogram was performed with findings as dictated above.  A 0.38 sensor guidewire was then advanced up the left ureter into the renal pelvis under fluoroscopic guidance. The 6 Fr semirigid ureteroscope was then advanced into the ureter next to the guidewire and the calculus was identified. A second guidewire was used to help open the ureter and allow the ureteroscope to advance up the ureter without difficulty.   The stone was then fragmented with the 365 micron holmium laser fiber on a setting of 0.6 J and frequency of 6 Hz.   All stones were then removed from the ureter with a zero tip nitinol basket.  Reinspection of the ureter revealed no remaining visible stones or fragments.   The wire was then backloaded through the cystoscope and a ureteral stent was advance over the wire using Seldinger technique.  The stent was positioned appropriately under fluoroscopic and cystoscopic guidance.  The wire was then removed with an adequate stent curl noted in the renal pelvis as well as in the bladder.  The bladder was then emptied and the procedure ended.  The patient appeared to tolerate the procedure well and without complications.  The patient was able to be awakened and transferred to the recovery unit in satisfactory condition.

## 2011-02-23 NOTE — Transfer of Care (Signed)
Immediate Anesthesia Transfer of Care Note  Patient: KENOSHA DOSTER  Procedure(s) Performed:  CYSTOSCOPY/RETROGRADE/URETEROSCOPY - CYSTOSCOPY, LEFT RETROGRADE PYLEOGRADE//LEFT URETEROSCOPY LASER LITHOTRIPSY, LEFT URETERAL STENT  PLACEMENT  Patient Location: PACU  Anesthesia Type: General  Level of Consciousness: awake, sedated and patient cooperative  Airway & Oxygen Therapy: Patient Spontanous Breathing and Patient connected to face mask oxygen  Post-op Assessment: Report given to PACU RN and Post -op Vital signs reviewed and stable  Post vital signs: Reviewed and stable  Complications: No apparent anesthesia complications

## 2011-02-23 NOTE — Discharge Instructions (Addendum)
Please call if fever > 101 or uncontrolled vomiting or pain.  Remove stent by pulling string on Monday morning.  You may some pain after removing the stent and should take pain medication if needed.  This usually will subside after a couple of hours if it occurs.Kidney Stones Kidney stones (ureteral lithiasis) are deposits that form inside your kidneys. The intense pain is caused by the stone moving through the urinary tract. When the stone moves, the ureter goes into spasm around the stone. The stone is usually passed in the urine.  CAUSES   A disorder that makes certain neck glands produce too much parathyroid hormone (primary hyperparathyroidism).   A buildup of uric acid crystals.   Narrowing (stricture) of the ureter.   A kidney obstruction present at birth (congenital obstruction).   Previous surgery on the kidney or ureters.   Numerous kidney infections.  SYMPTOMS   Feeling sick to your stomach (nauseous).   Throwing up (vomiting).   Blood in the urine (hematuria).   Pain that usually spreads (radiates) to the groin.   Frequency or urgency of urination.  DIAGNOSIS   Taking a history and physical exam.   Blood or urine tests.   Computerized X-ray scan (CT scan).   Occasionally, an examination of the inside of the urinary bladder (cystoscopy) is performed.  TREATMENT   Observation.   Increasing your fluid intake.   Surgery may be needed if you have severe pain or persistent obstruction.  The size, location, and chemical composition are all important variables that will determine the proper choice of action for you. Talk to your caregiver to better understand your situation so that you will minimize the risk of injury to yourself and your kidney.  HOME CARE INSTRUCTIONS   Drink enough water and fluids to keep your urine clear or pale yellow.   Strain all urine through the provided strainer. Keep all particulate matter and stones for your caregiver to see. The  stone causing the pain may be as small as a grain of salt. It is very important to use the strainer each and every time you pass your urine. The collection of your stone will allow your caregiver to analyze it and verify that a stone has actually passed.   Only take over-the-counter or prescription medicines for pain, discomfort, or fever as directed by your caregiver.   Make a follow-up appointment with your caregiver as directed.   Get follow-up X-rays if required. The absence of pain does not always mean that the stone has passed. It may have only stopped moving. If the urine remains completely obstructed, it can cause loss of kidney function or even complete destruction of the kidney. It is your responsibility to make sure X-rays and follow-ups are completed. Ultrasounds of the kidney can show blockages and the status of the kidney. Ultrasounds are not associated with any radiation and can be performed easily in a matter of minutes.  SEEK IMMEDIATE MEDICAL CARE IF:   Pain cannot be controlled with the prescribed medicine.   You have a fever.   The severity or intensity of pain increases over 18 hours and is not relieved by pain medicine.   You develop a new onset of abdominal pain.   You feel faint or pass out.  MAKE SURE YOU:   Understand these instructions.   Will watch your condition.   Will get help right away if you are not doing well or get worse.  Document Released: 03/27/2005 Document Revised:  12/07/2010 Document Reviewed: 07/23/2009 [facility] Patient Information 2012 [place], [place].

## 2011-02-23 NOTE — Anesthesia Postprocedure Evaluation (Signed)
  Anesthesia Post-op Note  Patient: Elizabeth [name]  Procedure(s) Performed:  CYSTOSCOPY/RETROGRADE/URETEROSCOPY - CYSTOSCOPY, LEFT RETROGRADE PYLEOGRADE//LEFT URETEROSCOPY LASER LITHOTRIPSY, LEFT URETERAL STENT  PLACEMENT  Patient Location: PACU  Anesthesia Type: General  Level of Consciousness: awake and alert   Airway and Oxygen Therapy: Patient Spontanous Breathing  Post-op Pain: mild  Post-op Assessment: Post-op Vital signs reviewed, Patient's Cardiovascular Status Stable, Respiratory Function Stable, Patent Airway and No signs of Nausea or vomiting  Post-op Vital Signs: stable  Complications: No apparent anesthesia complications

## 2011-02-23 NOTE — Progress Notes (Signed)
Pt ambulated well to BR  

## 2011-02-23 NOTE — Preoperative (Signed)
Beta Blockers   Reason not to administer Beta Blockers:Not Applicable 

## 2011-02-23 NOTE — H&P (View-Only) (Signed)
H&P  Chief Complaint: Left ureteral stone  History of Present Illness: Elizabeth [name] is a 25 y.o. with 1 week of severe intermittent left flank pain. She has associate nausea but no fever or hematuria. She has no prior episode of stones. Her pain in uncontrolled.  Past Medical History  Diagnosis Date  . Trichomonas   . Bartholin cyst   . Bacterial vaginosis     Past Surgical History  Procedure Date  . Induced abortion     Home Medications:  Medications Prior to Admission  Medication Dose Route Frequency Provider Last Rate Last Dose  . ciprofloxacin (CIPRO) IVPB 400 mg  400 mg Intravenous Once Flint Melter, MD   400 mg at 02/20/11 0356  . HYDROmorphone (DILAUDID) injection 1 mg  1 mg Intravenous Once Flint Melter, MD   1 mg at 02/20/11 0715  . iohexol (OMNIPAQUE) 300 MG/ML injection 100 mL  100 mL Intravenous Once PRN Medication Radiologist   100 mL at 02/20/11 0213  . iohexol (OMNIPAQUE) 300 MG/ML injection 100 mL  100 mL Intravenous Once PRN Medication Radiologist      . morphine 4 MG/ML injection 4 mg  4 mg Intravenous Once Gwyneth Sprout, MD   4 mg at 02/19/11 2104  . morphine 4 MG/ML injection 4 mg  4 mg Intravenous Once Flint Melter, MD   4 mg at 02/20/11 0345  . ondansetron (ZOFRAN) injection 4 mg  4 mg Intravenous Once Gwyneth Sprout, MD   4 mg at 02/19/11 2059  . ondansetron (ZOFRAN) injection 4 mg  4 mg Intravenous Once Flint Melter, MD   4 mg at 02/20/11 0411  . sodium chloride 0.9 % bolus 1,000 mL  1,000 mL Intravenous Once Gwyneth Sprout, MD   1,000 mL at 02/19/11 2059   No current outpatient prescriptions on file as of 02/19/2011.    Allergies: No Known Allergies  No family history on file. Paternal history of kidney stones.  Social History:  reports that she quit smoking about 2 years ago. She has never used smokeless tobacco. She reports that she does not drink alcohol or use illicit drugs.  ROS: A complete review of systems was performed.   All systems are negative except for pertinent findings as noted.  Physical Exam:  Vital signs in last 24 hours: Temp:  [97.5 F (36.4 C)-99.2 F (37.3 C)] 98.4 F (36.9 C) (11/12 0348) Pulse Rate:  [69-86] 69  (11/12 0348) Resp:  [18-20] 18  (11/12 0033) BP: (113-204)/(64-153) 114/82 mmHg (11/12 0348) SpO2:  [100 %] 100 % (11/12 0348) General:  Alert and oriented, No acute distress HEENT: Normocephalic, atraumatic Neck: No JVD or lymphadenopathy Cardiovascular: Regular rate and rhythm Lungs: Clear bilaterally Abdomen: Soft, nontender, nondistended, no abdominal masses Back: No CVA tenderness Extremities: No edema Neurologic: Grossly intact  Laboratory Data:  Results for orders placed during the hospital encounter of 02/19/11 (from the past 24 hour(s))  URINALYSIS, ROUTINE W REFLEX MICROSCOPIC     Status: Abnormal   Collection Time   02/19/11  8:36 PM      Component Value Range   Color, Urine AMBER (*) YELLOW    Appearance CLEAR  CLEAR    Specific Gravity, Urine 1.029  1.005 - 1.030    pH 6.0  5.0 - 8.0    Glucose, UA NEGATIVE  NEGATIVE (mg/dL)   Hgb urine dipstick NEGATIVE  NEGATIVE    Bilirubin Urine NEGATIVE  NEGATIVE    Ketones, ur TRACE (*)  NEGATIVE (mg/dL)   Protein, ur NEGATIVE  NEGATIVE (mg/dL)   Urobilinogen, UA 0.2  0.0 - 1.0 (mg/dL)   Nitrite POSITIVE (*) NEGATIVE    Leukocytes, UA SMALL (*) NEGATIVE   URINE MICROSCOPIC-ADD ON     Status: Abnormal   Collection Time   02/19/11  8:36 PM      Component Value Range   Squamous Epithelial / LPF RARE  RARE    WBC, UA 3-6  <3 (WBC/hpf)   RBC / HPF 3-6  <3 (RBC/hpf)   Bacteria, UA FEW (*) RARE    Urine-Other MUCOUS PRESENT    CBC     Status: Normal   Collection Time   02/19/11  9:06 PM      Component Value Range   WBC 8.9  4.0 - 10.5 (K/uL)   RBC 4.85  3.87 - 5.11 (MIL/uL)   Hemoglobin 13.8  12.0 - 15.0 (g/dL)   HCT 16.1  09.6 - 04.5 (%)   MCV 87.2  78.0 - 100.0 (fL)   MCH 28.5  26.0 - 34.0 (pg)   MCHC  32.6  30.0 - 36.0 (g/dL)   RDW 40.9  81.1 - 91.4 (%)   Platelets 365  150 - 400 (K/uL)  DIFFERENTIAL     Status: Normal   Collection Time   02/19/11  9:06 PM      Component Value Range   Neutrophils Relative 48  43 - 77 (%)   Neutro Abs 4.2  1.7 - 7.7 (K/uL)   Lymphocytes Relative 41  12 - 46 (%)   Lymphs Abs 3.6  0.7 - 4.0 (K/uL)   Monocytes Relative 9  3 - 12 (%)   Monocytes Absolute 0.8  0.1 - 1.0 (K/uL)   Eosinophils Relative 3  0 - 5 (%)   Eosinophils Absolute 0.3  0.0 - 0.7 (K/uL)   Basophils Relative 0  0 - 1 (%)   Basophils Absolute 0.0  0.0 - 0.1 (K/uL)  COMPREHENSIVE METABOLIC PANEL     Status: Abnormal   Collection Time   02/19/11  9:06 PM      Component Value Range   Sodium 138  135 - 145 (mEq/L)   Potassium 3.7  3.5 - 5.1 (mEq/L)   Chloride 101  96 - 112 (mEq/L)   CO2 24  19 - 32 (mEq/L)   Glucose, Bld 79  70 - 99 (mg/dL)   BUN 10  6 - 23 (mg/dL)   Creatinine, Ser 7.82 (*) 0.50 - 1.10 (mg/dL)   Calcium 95.6  8.4 - 10.5 (mg/dL)   Total Protein 8.6 (*) 6.0 - 8.3 (g/dL)   Albumin 4.4  3.5 - 5.2 (g/dL)   AST 17  0 - 37 (U/L)   ALT 8  0 - 35 (U/L)   Alkaline Phosphatase 73  39 - 117 (U/L)   Total Bilirubin 0.1 (*) 0.3 - 1.2 (mg/dL)   GFR calc non Af Amer 64 (*) >90 (mL/min)   GFR calc Af Amer 74 (*) >90 (mL/min)  LIPASE, BLOOD     Status: Normal   Collection Time   02/19/11  9:06 PM      Component Value Range   Lipase 41  11 - 59 (U/L)   Recent Results (from the past 240 hour(s))  WET PREP, GENITAL     Status: Abnormal   Collection Time   02/15/11  4:01 AM      Component Value Range Status Comment   Yeast, Wet Prep NONE  SEEN  NONE SEEN  Final    Trich, Wet Prep NONE SEEN  NONE SEEN  Final    Clue Cells, Wet Prep MODERATE (*) NONE SEEN  Final    WBC, Wet Prep HPF POC FEW (*) NONE SEEN  Final    Creatinine:  Basename 02/19/11 2106  CREATININE 1.19*    Radiologic Imaging: Ct Abdomen Pelvis W Contrast  02/20/2011  *RADIOLOGY REPORT*  Clinical Data:  Left flank and upper abdominal pain, and nausea and vomiting.  Red blood cells and white blood cells in the urine. Recently treated for bacterial vaginosis.  CT ABDOMEN AND PELVIS WITH CONTRAST  Technique:  Multidetector CT imaging of the abdomen and pelvis was performed following the standard protocol during bolus administration of intravenous contrast.  Contrast:  100 mL of Omnipaque 300 IV contrast  Comparison: Pelvic ultrasound performed 02/14/2011  Findings: The visualized lung bases are clear.  The liver and spleen are unremarkable in appearance.  The gallbladder is within normal limits.  The pancreas and adrenal glands are unremarkable.  There is mild to moderate left-sided hydronephrosis, with prominence of the left ureter to the level of an obstructing 6 x 5 mm stone at the distal left ureter, just above the left vesicoureteral junction.  There is diffusely decreased enhancement of the left kidney, with left renal enlargement, likely reflecting left-sided pyelonephritis.  There is also thickening of the wall of the left ureter, likely reflecting ureteritis.  No nonobstructing renal stones are identified.  No significant perinephric stranding is seen.  No free fluid is identified.  The small bowel is unremarkable in appearance.  The stomach is within normal limits.  No acute vascular abnormalities are seen.  The appendix is normal in caliber and contains contrast, without evidence of appendicitis.  The colon is also filled with contrast and is unremarkable in appearance.  The bladder is mildly distended; the mildly thick-walled appearance of the bladder may reflect cystitis.  There is mild prominence and increased enhancement of the uterus; this may reflect sequelae of the bacterial vaginosis as clinically described.  The ovaries are relatively symmetric; no suspicious adnexal masses are seen.  No inguinal lymphadenopathy is seen.  No acute osseous abnormalities are identified.  IMPRESSION:  1.  Mild to  moderate left-sided hydronephrosis, with an obstructing 6 x 5 mm stone at the distal left ureter, just above the left vesicoureteral junction. 2.  Diffusely decreased enhancement of the left kidney, with left renal enlargement, likely reflecting left-sided pyelonephritis. Wall thickening along the left ureter likely reflects ureteritis. Mildly thick-walled appearance of the bladder may reflect cystitis. 3.  Increased enhancement of the uterus, with mild prominence of the uterine wall and cervix; this may reflect sequelae of the clinically described bacterial vaginosis.  Original Report Authenticated By: Tonia Ghent, M.D.    Impression/Assessment:  6 mm left ureteral stone  Plan:  Admit for IVF hydration and IV pain control.  Ohanna Gassert,LES 02/20/2011, 7:55 AM  Moody Bruins MD

## 2011-02-23 NOTE — Interval H&P Note (Signed)
History and Physical Interval Note:   02/23/2011   3:09 PM   Elizabeth [name]  has presented today for surgery, with the diagnosis of LEFT URETERAL  STONE  The various methods of treatment have been discussed with the patient and family. After consideration of risks, benefits and other options for treatment, the patient has consented to  Procedure(s): CYSTOSCOPY WITH URETEROSCOPY as a surgical intervention .  The patients' history has been reviewed, patient examined, no change in status, stable for surgery.  I have reviewed the patients' chart and labs.  Questions were answered to the patient's satisfaction.     Corinthia Helmers,LES  MD

## 2011-02-24 ENCOUNTER — Encounter (HOSPITAL_COMMUNITY): Payer: Self-pay | Admitting: Urology

## 2014-02-09 ENCOUNTER — Encounter (HOSPITAL_COMMUNITY): Payer: Self-pay | Admitting: Urology

## 2018-02-19 ENCOUNTER — Other Ambulatory Visit: Payer: Self-pay

## 2018-02-19 ENCOUNTER — Encounter (HOSPITAL_COMMUNITY): Payer: Self-pay | Admitting: Emergency Medicine

## 2018-02-19 ENCOUNTER — Ambulatory Visit (HOSPITAL_COMMUNITY)
Admission: EM | Admit: 2018-02-19 | Discharge: 2018-02-19 | Disposition: A | Discharge status: Home / Self Care | Payer: BLUE CROSS/BLUE SHIELD | Attending: Family Medicine | Admitting: Family Medicine

## 2018-02-19 DIAGNOSIS — N3091 Cystitis, unspecified with hematuria: Secondary | ICD-10-CM | POA: Insufficient documentation

## 2018-02-19 DIAGNOSIS — N189 Chronic kidney disease, unspecified: Secondary | ICD-10-CM | POA: Diagnosis not present

## 2018-02-19 DIAGNOSIS — Z87442 Personal history of urinary calculi: Secondary | ICD-10-CM | POA: Diagnosis not present

## 2018-02-19 DIAGNOSIS — N3090 Cystitis, unspecified without hematuria: Secondary | ICD-10-CM | POA: Diagnosis not present

## 2018-02-19 DIAGNOSIS — Z87891 Personal history of nicotine dependence: Secondary | ICD-10-CM | POA: Diagnosis not present

## 2018-02-19 DIAGNOSIS — Z793 Long term (current) use of hormonal contraceptives: Secondary | ICD-10-CM | POA: Diagnosis not present

## 2018-02-19 LAB — POCT URINALYSIS DIP (DEVICE)
BILIRUBIN URINE: NEGATIVE
GLUCOSE, UA: NEGATIVE mg/dL
KETONES UR: NEGATIVE mg/dL
Nitrite: NEGATIVE
Protein, ur: NEGATIVE mg/dL
Specific Gravity, Urine: 1.02 (ref 1.005–1.030)
Urobilinogen, UA: 0.2 mg/dL (ref 0.0–1.0)
pH: 7 (ref 5.0–8.0)

## 2018-02-19 LAB — POCT PREGNANCY, URINE: PREG TEST UR: NEGATIVE

## 2018-02-19 MED ORDER — FLUCONAZOLE 150 MG PO TABS: 150.0000 mg | ORAL_TABLET | Freq: Every day | ORAL | 0 refills | Status: DC

## 2018-02-19 MED ORDER — CEPHALEXIN 500 MG PO CAPS: 500.0000 mg | ORAL_CAPSULE | Freq: Two times a day (BID) | ORAL | 0 refills | Status: AC

## 2018-02-19 NOTE — ED Triage Notes (Signed)
Patient noticed symptoms 1 1/2 weeks ago.  Took azo, but symptoms continued.  Patient feels the need to urinate, but only small amounts.  Painful urination.  Blood in urine.

## 2018-02-19 NOTE — Discharge Instructions (Signed)
Your urine was positive for an urinary tract infection. Start keflex as directed. Keep hydrated, your urine should be clear to pale yellow in color. I have called in diflucan, you can start to prevent yeast infection. Cytology sent, you will be contacted with any positive results that requires further treatment. Monitor for any worsening of symptoms, fever, abdominal pain, nausea, vomiting, to follow up for reevaluation.

## 2018-02-19 NOTE — ED Provider Notes (Signed)
[facility]    CSN: [id number] Arrival date & time: 02/19/18  1622     History   Chief Complaint Chief Complaint  Patient presents with  . Urinary Tract Infection    HPI Elizabeth [name] is a 32 y.o. female.   32 year old female comes in for 1.5-week history of urinary symptoms.  States started out with frequency, mild dysuria, urinary hesitancy, hematuria.  Took AZO, but symptoms continued.  States now will feel the urge to urinate, but would only urinate in small amounts.  Denies fever, chills, night sweats.  Denies abdominal pain, nausea, vomiting.  Does have small amounts of vaginal discharge, denies itching/pain.  Sexually active with one female partner, no condom use.  LMP 02/12/2018.  States that had to close cycles this month.  History of kidney stones.     Past Medical History:  Diagnosis Date  . Anemia    low iron on no meds  . Bacterial vaginosis   . Bartholin cyst   . 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 Chronic kidney disease    left ureteral stone   . Recurrent upper respiratory infection (URI)    01/09/11 URI - no problems now   . Trichomonas     There are no active problems to display for this patient.   Past Surgical History:  Procedure Laterality Date  . CYSTOSCOPY/RETROGRADE/URETEROSCOPY  02/23/2011   Procedure: CYSTOSCOPY/RETROGRADE/URETEROSCOPY;  Surgeon: Crecencio Mc, MD;  Location: [facility];  Service: Urology;  Laterality: Left;  CYSTOSCOPY, LEFT RETROGRADE PYLEOGRADE//LEFT URETEROSCOPY LASER LITHOTRIPSY, LEFT URETERAL STENT  PLACEMENT  . INDUCED ABORTION    . NO PAST SURGERIES     no previous surgeries     OB History    Gravida  2   Para  1   Term  1   Preterm  0   AB  0   Living  1     SAB  0   TAB  0   Ectopic  0   Multiple  0   Live Births               Home Medications    Prior to Admission medications   Medication Sig Start Date End Date Taking? Authorizing Provider  etonogestrel (NEXPLANON) 68 MG IMPL  implant 1 each by Subdermal route once.   Yes [provider]  cephALEXin (KEFLEX) 500 MG capsule Take 1 capsule (500 mg total) by mouth 2 (two) times daily for 5 days. 02/19/18 02/24/18  Belinda Fisher, PA-C  fluconazole (DIFLUCAN) 150 MG tablet Take 1 tablet (150 mg total) by mouth daily. Take second dose 72 hours later if symptoms still persists. 02/19/18   Belinda Fisher, PA-C    Family History No family history on file.  Social History Social History   Tobacco Use  . Smoking status: Former Smoker    Last attempt to quit: 01/08/2009    Years since quitting: 9.1  . Smokeless tobacco: Never Used  Substance Use Topics  . Alcohol use: No  . Drug use: No     Allergies   Patient has no known allergies.   Review of Systems Review of Systems  Reason unable to perform ROS: See HPI as above.     Physical Exam Triage Vital Signs ED Triage Vitals  Enc Vitals Group     BP 02/19/18 1645 113/69     Pulse Rate 02/19/18 1645 76     Resp --  Temp 02/19/18 1645 98 F (36.7 C)     Temp Source 02/19/18 1645 Oral     SpO2 02/19/18 1645 100 %     Weight --      Height --      Head Circumference --      Peak Flow --      Pain Score 02/19/18 1641 3     Pain Loc --      Pain Edu? --      Excl. in [place]? --    No data found.  Updated Vital Signs BP 113/69 (BP Location: Left Arm)   Pulse 76   Temp 98 F (36.7 C) (Oral)   SpO2 100%   Physical Exam  Constitutional: She is oriented to person, place, and time. She appears well-developed and well-nourished. No distress.  HENT:  Head: Normocephalic and atraumatic.  Eyes: Pupils are equal, round, and reactive to light. Conjunctivae are normal.  Cardiovascular: Normal rate, regular rhythm and normal heart sounds. Exam reveals no gallop and no friction rub.  No murmur heard. Pulmonary/Chest: Effort normal and breath sounds normal. She has no wheezes. She has no rales.  Abdominal: Soft. Bowel sounds are normal. She exhibits no mass.  There is no tenderness. There is no rebound, no guarding and no CVA tenderness.  Genitourinary: Vagina normal. There is no rash or tenderness on the right labia. There is no rash or tenderness on the left labia. Cervix exhibits no discharge.  Neurological: She is alert and oriented to person, place, and time.  Skin: Skin is warm and dry.  Psychiatric: She has a normal mood and affect. Her behavior is normal. Judgment normal.     UC Treatments / Results  Labs (all labs ordered are listed, but only abnormal results are displayed) Labs Reviewed  POCT URINALYSIS DIP (DEVICE) - Abnormal; Notable for the following components:      Result Value   Hgb urine dipstick MODERATE (*)    Leukocytes, UA MODERATE (*)    All other components within normal limits  URINE CULTURE  POCT PREGNANCY, URINE  CERVICOVAGINAL ANCILLARY ONLY    EKG None  Radiology No results found.  Procedures Procedures (including critical care time)  Medications Ordered in UC Medications - No data to display  Initial Impression / Assessment and Plan / UC Course  I have reviewed the triage vital signs and the nursing notes.  Pertinent labs & imaging results that were available during my care of the patient were reviewed by me and considered in my medical decision making (see chart for details).    Urine dipstick positive for UTI. Start antibiotics as directed. Push fluids. Cytology sent. Return precautions given.  Final Clinical Impressions(s) / UC Diagnoses   Final diagnoses:  Cystitis    ED Prescriptions    Medication Sig Dispense Auth. Provider   cephALEXin (KEFLEX) 500 MG capsule Take 1 capsule (500 mg total) by mouth 2 (two) times daily for 5 days. 10 capsule Sadler Teschner V, PA-C   fluconazole (DIFLUCAN) 150 MG tablet Take 1 tablet (150 mg total) by mouth daily. Take second dose 72 hours later if symptoms still persists. 2 tablet Threasa Alpha, PA-C 02/19/18 1742

## 2018-02-20 LAB — CERVICOVAGINAL ANCILLARY ONLY
Bacterial vaginitis: POSITIVE — AB
CANDIDA VAGINITIS: NEGATIVE
CHLAMYDIA, DNA PROBE: NEGATIVE
Neisseria Gonorrhea: NEGATIVE
TRICH (WINDOWPATH): NEGATIVE

## 2018-02-21 ENCOUNTER — Telehealth (HOSPITAL_COMMUNITY): Payer: Self-pay

## 2018-02-21 MED ORDER — METRONIDAZOLE 500 MG PO TABS: 500.0000 mg | ORAL_TABLET | Freq: Two times a day (BID) | ORAL | 0 refills | Status: DC

## 2018-02-21 NOTE — Telephone Encounter (Signed)
Bacterial vaginosis is positive. This was not treated at the urgent care visit.  Flagyl 500 mg BID x 7 days #14 no refills sent to patients pharmacy of choice.    Attempted to reach patient. No answer at this time. Voicemail left.    

## 2018-02-22 ENCOUNTER — Telehealth (HOSPITAL_COMMUNITY): Payer: Self-pay | Admitting: Emergency Medicine

## 2018-02-22 LAB — URINE CULTURE: Culture: >=100000 — AB

## 2018-02-22 NOTE — Telephone Encounter (Signed)
Urine culture was positive for STAPHYLOCOCCUS SAPROPHYTICUS and was given Keflex at urgent care visit. Unsure of this medications sensitivity, per Dr. Delton SeeNelson, if patient is still symptomatic to send in macrobid 100mg  BID x5 days. Attempted to reach patient x2. No answer at this time. Voicemail left.

## 2019-06-01 ENCOUNTER — Ambulatory Visit (HOSPITAL_COMMUNITY)
Admission: EM | Admit: 2019-06-01 | Discharge: 2019-06-01 | Disposition: A | Discharge status: Home / Self Care | Payer: BC Managed Care – PPO | Attending: Emergency Medicine | Admitting: Emergency Medicine

## 2019-06-01 ENCOUNTER — Other Ambulatory Visit: Payer: Self-pay

## 2019-06-01 ENCOUNTER — Encounter (HOSPITAL_COMMUNITY): Payer: Self-pay

## 2019-06-01 DIAGNOSIS — R4020 Unspecified coma: Secondary | ICD-10-CM | POA: Diagnosis present

## 2019-06-01 DIAGNOSIS — R55 Syncope and collapse: Secondary | ICD-10-CM | POA: Diagnosis not present

## 2019-06-01 DIAGNOSIS — Z3202 Encounter for pregnancy test, result negative: Secondary | ICD-10-CM | POA: Diagnosis not present

## 2019-06-01 HISTORY — DX: Unspecified asthma, uncomplicated: J45.909

## 2019-06-01 LAB — COMPREHENSIVE METABOLIC PANEL
ALT: 15 U/L (ref 0–44)
AST: 19 U/L (ref 15–41)
Albumin: 3.7 g/dL (ref 3.5–5.0)
Alkaline Phosphatase: 51 U/L (ref 38–126)
Anion gap: 9 (ref 5–15)
BUN: 16 mg/dL (ref 6–20)
CO2: 23 mmol/L (ref 22–32)
Calcium: 9.2 mg/dL (ref 8.9–10.3)
Chloride: 105 mmol/L (ref 98–111)
Creatinine, Ser: 1.01 mg/dL — ABNORMAL HIGH (ref 0.44–1.00)
GFR calc Af Amer: >60 mL/min (ref >60)
GFR calc non Af Amer: >60 mL/min (ref >60)
Glucose, Bld: 81 mg/dL (ref 70–99)
Potassium: 3.8 mmol/L (ref 3.5–5.1)
Sodium: 137 mmol/L (ref 135–145)
Total Bilirubin: 0.4 mg/dL (ref 0.3–1.2)
Total Protein: 6.7 g/dL (ref 6.5–8.1)

## 2019-06-01 LAB — CBC WITH DIFFERENTIAL/PLATELET
Abs Immature Granulocytes: 0.02 10*3/uL (ref 0.00–0.07)
Basophils Absolute: 0.1 10*3/uL (ref 0.0–0.1)
Basophils Relative: 1 %
Eosinophils Absolute: 0.1 10*3/uL (ref 0.0–0.5)
Eosinophils Relative: 2 %
HCT: 39.8 % (ref 36.0–46.0)
Hemoglobin: 12.9 g/dL (ref 12.0–15.0)
Immature Granulocytes: 0 %
Lymphocytes Relative: 40 %
Lymphs Abs: 3 10*3/uL (ref 0.7–4.0)
MCH: 28.8 pg (ref 26.0–34.0)
MCHC: 32.4 g/dL (ref 30.0–36.0)
MCV: 88.8 fL (ref 80.0–100.0)
Monocytes Absolute: 0.5 10*3/uL (ref 0.1–1.0)
Monocytes Relative: 7 %
Neutro Abs: 3.7 10*3/uL (ref 1.7–7.7)
Neutrophils Relative %: 50 %
Platelets: 288 10*3/uL (ref 150–400)
RBC: 4.48 MIL/uL (ref 3.87–5.11)
RDW: 13.1 % (ref 11.5–15.5)
WBC: 7.4 10*3/uL (ref 4.0–10.5)
nRBC: 0 % (ref 0.0–0.2)

## 2019-06-01 LAB — POCT URINALYSIS DIP (DEVICE)
Bilirubin Urine: NEGATIVE
Glucose, UA: NEGATIVE mg/dL
Hgb urine dipstick: NEGATIVE
Ketones, ur: NEGATIVE mg/dL
Leukocytes,Ua: NEGATIVE
Nitrite: NEGATIVE
Protein, ur: NEGATIVE mg/dL
Specific Gravity, Urine: 1.025 (ref 1.005–1.030)
Urobilinogen, UA: 0.2 mg/dL (ref 0.0–1.0)
pH: 7.5 (ref 5.0–8.0)

## 2019-06-01 LAB — POCT PREGNANCY, URINE: Preg Test, Ur: NEGATIVE

## 2019-06-01 LAB — POC URINE PREG, ED: Preg Test, Ur: NEGATIVE

## 2019-06-01 NOTE — ED Provider Notes (Signed)
[facility]    CSN: [id number] Arrival date & time: 06/01/19  1502      History   Chief Complaint Chief Complaint  Patient presents with  . Loss of Consciousness    HPI Elizabeth [name] is a 34 y.o. female.   Wonda Olds presents with complaints of episode of loc yesterday morning. Had just woken up, went to her couch and laid down. Then stood up from the couch, and next woke up on the floor. Boyfriend was present and endorses that she did not hit her head and she did not have any shaking or tremors. Lasted seconds before she came to again. No dizziness. No nausea or vomiting. Following this she did note that her legs felt fatigued, she laid in bed the rest of the day and by end of the day her legs "felt like noodles."  Today she has felt well. No chest pain , no headache, no cough, no shortness of breath , no chest pain , no dizziness. LMP 2/7 and was normal. She has a nexplanon. No palpitations. Denies any previous similar. No known ill contacts. No contributing medical history. She hadn't eaten any meals prior to this occurrence. No acute complaints.     ROS per HPI, negative if not otherwise mentioned.      Past Medical History:  Diagnosis Date  . Anemia    low iron on no meds  . Asthma   . Bacterial vaginosis   . Bartholin cyst   . 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 Chronic kidney disease    left ureteral stone   . Recurrent upper respiratory infection (URI)    01/09/11 URI - no problems now   . Trichomonas     There are no problems to display for this patient.   Past Surgical History:  Procedure Laterality Date  . CYSTOSCOPY/RETROGRADE/URETEROSCOPY  02/23/2011   Procedure: CYSTOSCOPY/RETROGRADE/URETEROSCOPY;  Surgeon: Crecencio Mc, MD;  Location: [facility];  Service: Urology;  Laterality: Left;  CYSTOSCOPY, LEFT RETROGRADE PYLEOGRADE//LEFT URETEROSCOPY LASER LITHOTRIPSY, LEFT URETERAL STENT  PLACEMENT  . INDUCED ABORTION    . NO PAST SURGERIES     no previous surgeries     OB History    Gravida  2   Para  1   Term  1   Preterm  0   AB  0   Living  1     SAB  0   TAB  0   Ectopic  0   Multiple  0   Live Births               Home Medications    Prior to Admission medications   Medication Sig Start Date End Date Taking? Authorizing Provider  etonogestrel (NEXPLANON) 68 MG IMPL implant 1 each by Subdermal route once.    [provider]  fluconazole (DIFLUCAN) 150 MG tablet Take 1 tablet (150 mg total) by mouth daily. Take second dose 72 hours later if symptoms still persists. 02/19/18   Cathie Hoops, Amy V, PA-C  metroNIDAZOLE (FLAGYL) 500 MG tablet Take 1 tablet (500 mg total) by mouth 2 (two) times daily. 02/21/18   Eustace Moore, MD    Family History History reviewed. No pertinent family history.  Social History Social History   Tobacco Use  . Smoking status: Former Smoker    Quit date: 01/08/2009    Years since quitting: 10.4  . Smokeless tobacco: Never Used  Substance Use Topics  .  Alcohol use: No  . Drug use: No     Allergies   Patient has no known allergies.   Review of Systems Review of Systems   Physical Exam Triage Vital Signs ED Triage Vitals  Enc Vitals Group     BP 06/01/19 1600 115/64     Pulse Rate 06/01/19 1600 72     Resp 06/01/19 1600 18     Temp 06/01/19 1600 98.5 F (36.9 C)     Temp Source 06/01/19 1600 Oral     SpO2 06/01/19 1600 100 %     Weight --      Height --      Head Circumference --      Peak Flow --      Pain Score 06/01/19 1604 0     Pain Loc --      Pain Edu? --      Excl. in [place]? --    Orthostatic VS for the past 24 hrs:  BP- Lying Pulse- Lying BP- Sitting Pulse- Sitting BP- Standing at 0 minutes Pulse- Standing at 0 minutes  06/01/19 1601 112/81 68 115/64 72 105/63 66    Updated Vital Signs BP 115/64 (BP Location: Right Arm)   Pulse 72   Temp 98.5 F (36.9 C) (Oral)   Resp 18   LMP 05/18/2019   SpO2 100%   Physical  Exam Constitutional:      General: She is not in acute distress.    Appearance: She is well-developed.  HENT:     Head: Normocephalic and atraumatic.     Mouth/Throat:     Mouth: Mucous membranes are moist.  Eyes:     Extraocular Movements: Extraocular movements intact.     Pupils: Pupils are equal, round, and reactive to light.  Cardiovascular:     Rate and Rhythm: Normal rate and regular rhythm.  Pulmonary:     Effort: Pulmonary effort is normal.     Breath sounds: Normal breath sounds.  Musculoskeletal:        General: Normal range of motion.     Cervical back: Normal range of motion.  Skin:    General: Skin is warm and dry.  Neurological:     General: No focal deficit present.     Mental Status: She is alert and oriented to person, place, and time. Mental status is at baseline.     Cranial Nerves: No cranial nerve deficit.     Motor: No weakness.     Gait: Gait normal.  Psychiatric:        Mood and Affect: Mood normal.    EKG:  NRS rate of 81, arrhythmia noted . Previous EKG was not available for review. No stwave changes as interpreted by me.    UC Treatments / Results  Labs (all labs ordered are listed, but only abnormal results are displayed) Labs Reviewed  CBC WITH DIFFERENTIAL/PLATELET  COMPREHENSIVE METABOLIC PANEL  POC URINE PREG, ED  POCT URINALYSIS DIP (DEVICE)  POCT PREGNANCY, URINE    EKG   Radiology No results found.  Procedures Procedures (including critical care time)  Medications Ordered in UC Medications - No data to display  Initial Impression / Assessment and Plan / UC Course  I have reviewed the triage vital signs and the nursing notes.  Pertinent labs & imaging results that were available during my care of the patient were reviewed by me and considered in my medical decision making (see chart for details).     ekg  without acute changes although noted arrythmia. No current symptoms or complaints. Syncopal episode on standing  yesterday morning. Hadn't eaten yet. Urine unremarkable. cmp and cbc pending. No chest pain , no dizziness, no palpitations. No recurrent episodes. Orthostatics are technically normal although noted mild drop in BP with standing. I suspect this is what occurred yesterday morning prior to eating. Encouraged hydration, careful with lay to stand. Return precautions provided. Encouraged follow up with pcp as needed. Patient verbalized understanding and agreeable to plan.  Ambulatory out of clinic without difficulty.    Final Clinical Impressions(s) / UC Diagnoses   Final diagnoses:  Loss of consciousness [facility])     Discharge Instructions     Your exam, ekg, urine sample look well today.  I suspect your blood pressure may have dropped when you stood up which caused you to pass out like that.  Sit at the edge of the bed for 30-60 seconds before standing to avoid recurrence of this.  Drink plenty of water regularly to stay hydrated and eat regular meals.  I will call you if you have any concerning blood test findings, this evening. If normal you won't hear from me. You may monitor your results on your MyChart online as well.   Please follow up with your primary care provider for a recheck in the next few weeks.  If this recurs or you hit your head, have chest pain, dizziness, vision changes or otherwise worsening please go to the ER.     ED Prescriptions    None     PDMP not reviewed this encounter.   Georgetta Haber, NP 06/01/19 [phone]

## 2019-06-01 NOTE — Discharge Instructions (Signed)
Your exam, ekg, urine sample look well today.  I suspect your blood pressure may have dropped when you stood up which caused you to pass out like that.  Sit at the edge of the bed for 30-60 seconds before standing to avoid recurrence of this.  Drink plenty of water regularly to stay hydrated and eat regular meals.  I will call you if you have any concerning blood test findings, this evening. If normal you won't hear from me. You may monitor your results on your MyChart online as well.   Please follow up with your primary care provider for a recheck in the next few weeks.  If this recurs or you hit your head, have chest pain, dizziness, vision changes or otherwise worsening please go to the ER.

## 2019-06-01 NOTE — ED Notes (Signed)
Spoke to pt in the lobby.  Pt states she stood up yesterday morning and yawned and stretched and then she passed out.  Pt states she rested the rest of the day and felt like her legs were shaky all day.

## 2019-06-01 NOTE — ED Triage Notes (Signed)
Pt states she had a loss of consciousness yesterday from unknown source.

## 2019-12-16 ENCOUNTER — Emergency Department (HOSPITAL_COMMUNITY)
Admission: EM | Admit: 2019-12-16 | Discharge: 2019-12-16 | Disposition: A | Discharge status: Home / Self Care | Payer: BC Managed Care – PPO | Attending: Emergency Medicine | Admitting: Emergency Medicine

## 2019-12-16 ENCOUNTER — Other Ambulatory Visit: Payer: Self-pay

## 2019-12-16 ENCOUNTER — Encounter (HOSPITAL_COMMUNITY): Payer: Self-pay | Admitting: Obstetrics and Gynecology

## 2019-12-16 ENCOUNTER — Emergency Department (HOSPITAL_COMMUNITY): Payer: BC Managed Care – PPO

## 2019-12-16 DIAGNOSIS — R5381 Other malaise: Secondary | ICD-10-CM | POA: Diagnosis not present

## 2019-12-16 DIAGNOSIS — J45909 Unspecified asthma, uncomplicated: Secondary | ICD-10-CM | POA: Insufficient documentation

## 2019-12-16 DIAGNOSIS — Z20822 Contact with and (suspected) exposure to covid-19: Secondary | ICD-10-CM | POA: Insufficient documentation

## 2019-12-16 DIAGNOSIS — J4521 Mild intermittent asthma with (acute) exacerbation: Secondary | ICD-10-CM

## 2019-12-16 DIAGNOSIS — Z87891 Personal history of nicotine dependence: Secondary | ICD-10-CM | POA: Diagnosis not present

## 2019-12-16 DIAGNOSIS — J302 Other seasonal allergic rhinitis: Secondary | ICD-10-CM

## 2019-12-16 DIAGNOSIS — R5383 Other fatigue: Secondary | ICD-10-CM | POA: Diagnosis not present

## 2019-12-16 LAB — SARS CORONAVIRUS 2 BY RT PCR (HOSPITAL ORDER, PERFORMED IN ~~LOC~~ HOSPITAL LAB): SARS Coronavirus 2: NEGATIVE

## 2019-12-16 MED ORDER — ALBUTEROL SULFATE (2.5 MG/3ML) 0.083% IN NEBU
5.0000 mg | INHALATION_SOLUTION | Freq: Once | RESPIRATORY_TRACT | Status: AC
  Administered 2019-12-16: 5 mg via RESPIRATORY_TRACT
  Filled 2019-12-16: qty 6

## 2019-12-16 MED ORDER — ALBUTEROL SULFATE HFA 108 (90 BASE) MCG/ACT IN AERS: 2.0000 | INHALATION_SPRAY | Freq: Four times a day (QID) | RESPIRATORY_TRACT | 0 refills | Status: AC | PRN

## 2019-12-16 MED ORDER — LEVOCETIRIZINE DIHYDROCHLORIDE 5 MG PO TABS: 5.0000 mg | ORAL_TABLET | Freq: Every evening | ORAL | 0 refills | Status: DC

## 2019-12-16 NOTE — ED Triage Notes (Signed)
Patient reports to the Hospital for SOB. Patient reports she is an asthmatic and has used her rescus inhaler x12 in the last 24 hours.

## 2019-12-16 NOTE — ED Provider Notes (Signed)
[facility]-EMERGENCY DEPT Provider Note   CSN: [id number] Arrival date & time: 12/16/19  1504     History Chief Complaint  Patient presents with  . Asthma    Elizabeth [name] is a 34 y.o. female who presents with complaint of wheezing.  Patient states that she got her Covid vaccination, for the first 1 on December 12, 2018.  She started feeling very poorly after that.  She states that she helped a friend move who had a dog which she is allergic to.  Since that time she wheezing, runny nose, itchy throat, itchy eyes.  She has also had generalized malaise and fatigue.  She denies fevers or chills.  She denies any severe cough.  She took DayQuil and NyQuil which helped relieve some of her symptoms.  She took Claritin which she states did not help much   HPI    Past Medical History:  Diagnosis Date  . Anemia    low iron on no meds  . Asthma   . Bacterial vaginosis   . Bartholin cyst   . 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 Chronic kidney disease    left ureteral stone   . Recurrent upper respiratory infection (URI)    01/09/11 URI - no problems now   . Trichomonas     There are no problems to display for this patient.   Past Surgical History:  Procedure Laterality Date  . CYSTOSCOPY/RETROGRADE/URETEROSCOPY  02/23/2011   Procedure: CYSTOSCOPY/RETROGRADE/URETEROSCOPY;  Surgeon: Crecencio Mc, MD;  Location: [facility];  Service: Urology;  Laterality: Left;  CYSTOSCOPY, LEFT RETROGRADE PYLEOGRADE//LEFT URETEROSCOPY LASER LITHOTRIPSY, LEFT URETERAL STENT  PLACEMENT  . INDUCED ABORTION    . NO PAST SURGERIES     no previous surgeries      OB History    Gravida  2   Para  1   Term  1   Preterm  0   AB  0   Living  1     SAB  0   TAB  0   Ectopic  0   Multiple  0   Live Births              No family history on file.  Social History   Tobacco Use  . Smoking status: Former Smoker    Quit date: 01/08/2009    Years since quitting: 10.9  .  Smokeless tobacco: Never Used  Substance Use Topics  . Alcohol use: No  . Drug use: No    Home Medications Prior to Admission medications   Medication Sig Start Date End Date Taking? Authorizing Provider  etonogestrel (NEXPLANON) 68 MG IMPL implant 1 each by Subdermal route once.    [provider]  fluconazole (DIFLUCAN) 150 MG tablet Take 1 tablet (150 mg total) by mouth daily. Take second dose 72 hours later if symptoms still persists. 02/19/18   Cathie Hoops, Amy V, PA-C  metroNIDAZOLE (FLAGYL) 500 MG tablet Take 1 tablet (500 mg total) by mouth 2 (two) times daily. 02/21/18   Eustace Moore, MD    Allergies    Patient has no known allergies.  Review of Systems   Review of Systems Ten systems reviewed and are negative for acute change, except as noted in the HPI.   Physical Exam Updated Vital Signs BP (!) 104/91 (BP Location: Left Arm)   Pulse 85   Temp 98.8 F (37.1 C) (Oral)   Resp (!) 24   SpO2  100%   Physical Exam Physical Exam  Nursing note and vitals reviewed. Constitutional: She is oriented to person, place, and time. She appears well-developed and well-nourished. No distress.  HENT:  Head: Normocephalic and atraumatic.  Eyes: Conjunctivae normal and EOM are normal. Pupils are equal, round, and reactive to light. No scleral icterus.  Neck: Normal range of motion.  Cardiovascular: Normal rate, regular rhythm and normal heart sounds.  Exam reveals no gallop and no friction rub.   No murmur heard. Pulmonary/Chest: Wheezing, decreased air movement sounds amazed Abdominal: Soft. Bowel sounds are normal. She exhibits no distension and no mass. There is no tenderness. There is no guarding.  Neurological: She is alert and oriented to person, place, and time.  Skin: Skin is warm and dry. She is not diaphoretic.   ED Results / Procedures / Treatments   Labs (all labs ordered are listed, but only abnormal results are displayed) Labs Reviewed  SARS CORONAVIRUS 2 BY  RT PCR [facility] ORDER, PERFORMED IN [facility] LAB)    EKG EKG Interpretation  Date/Time:  Tuesday December 16 2019 15:31:54 EDT Ventricular Rate:  84 PR Interval:  124 QRS Duration: 72 QT Interval:  366 QTC Calculation: 432 R Axis:   98 Text Interpretation:  Suspect arm lead reversal, interpretation assumes no reversal Normal sinus rhythm Rightward axis Low voltage QRS Borderline ECG since last tracing no significant change Confirmed by Mancel Bale [phone]) on 12/16/2019 4:18:36 PM   Radiology DG Chest 2 View  Result Date: 12/16/2019 CLINICAL DATA:  Shortness of breath EXAM: CHEST - 2 VIEW COMPARISON:  01/09/2011 FINDINGS: The heart size and mediastinal contours are within normal limits. Both lungs are clear. The visualized skeletal structures are unremarkable. IMPRESSION: No active cardiopulmonary disease. Electronically Signed   By: Jasmine Pang M.D.   On: 12/16/2019 16:25    Procedures Procedures (including critical care time)  Medications Ordered in ED Medications  albuterol (PROVENTIL) (2.5 MG/3ML) 0.083% nebulizer solution 5 mg (has no administration in time range)    ED Course  I have reviewed the triage vital signs and the nursing notes.  Pertinent labs & imaging results that were available during my care of the patient were reviewed by me and considered in my medical decision making (see chart for details).    MDM Rules/Calculators/A&P                          BP (!) 104/91 (BP Location: Left Arm)   Pulse 85   Temp 98.8 F (37.1 C) (Oral)   Resp (!) 24   SpO2 100%  Patient here with wheezing.  I think her symptoms are likely multifactorial secondary to heightened immune reaction after Covid vaccination and seasonal allergies.  I discussed with the patient not wanting to give her any prednisone at this time as I believe her body still try to create immunity and thinks that would lower her response.  Will discharge with albuterol inhaler and a Xyzal  prescription.  I personally reviewed the patient's labs which include a Covid test which is negative.  I reviewed the patient's 2 view chest x-ray which shows no acute abnormalities.  EKG resultant shows normal sinus rhythm with a rate of 84 discussed return precautions.  Patient appears otherwise appropriate for discharge at this time. Final Clinical Impression(s) / ED Diagnoses Final diagnoses:  None    Rx / DC Orders ED Discharge Orders    None  Arthor Captain, PA-C 12/16/19 1825    Benjiman Core, MD 12/16/19 2231

## 2019-12-16 NOTE — Discharge Instructions (Signed)
Get help right away if: You are getting worse and do not respond to treatment during an asthma attack. You are short of breath when at rest or when doing very little physical activity. You have difficulty eating, drinking, or talking. You have chest pain or tightness. You develop a fast heartbeat or palpitations. You have a bluish color to your lips or fingernails. You are light-headed or dizzy, or you faint. Your peak flow reading is less than 50% of your personal best. You feel too tired to breathe normally. 

## 2021-01-03 ENCOUNTER — Other Ambulatory Visit: Payer: Self-pay | Admitting: Family Medicine

## 2021-01-03 DIAGNOSIS — R109 Unspecified abdominal pain: Secondary | ICD-10-CM

## 2021-01-29 ENCOUNTER — Other Ambulatory Visit: Payer: Self-pay

## 2021-01-29 ENCOUNTER — Ambulatory Visit
Admission: RE | Admit: 2021-01-29 | Discharge: 2021-01-29 | Disposition: A | Discharge status: Home / Self Care | Payer: BC Managed Care – PPO | Source: Ambulatory Visit | Attending: Family Medicine | Admitting: Family Medicine

## 2021-01-29 DIAGNOSIS — R109 Unspecified abdominal pain: Secondary | ICD-10-CM

## 2021-04-10 NOTE — L&D Delivery Note (Addendum)
Patient: Elizabeth [name] MRN: [id number]  GBS status: unknown  Patient is a 36 y.o. now [id number] s/p NSVD at [redacted]w[redacted]d, who was admitted for P PROM. P PROM 1h 34m prior to delivery with clear fluid.    Delivery Note At 9:04 PM a viable female was delivered via Vaginal, Spontaneous (Presentation: Right Occiput Anterior).  APGAR: Pending.  weight 6 lb 7.7 oz (2940 g).    Called to the room by nursing staff.  He is concerned that the patient was going to deliver while primary OB provider was in another delivery.  Head delivered ROA. No nuchal cord present. Shoulder and body delivered in usual fashion. Infant with spontaneous cry, placed on mother's abdomen, dried and bulb suctioned. Cord clamped x 2 after 1-minute delay, and cut by family member.  Primary OB provider arrived at this point and completed the delivery.  Please see providers note for remainder of delivery including delivery of the placenta, EBL, any lacerations noted.Cyndi Lennert Cresenzo 11/27/2021, 9:19 PM  While I attended a delivery in another room, Dr. Nobie Putnam attended the delivery for Ms. Elizabeth [name].  The patient was admitted this morning after P PROM.  She progressed quickly in labor without augmentation.  The patient pushed for less than 5 minutes without an epidural and delivered a vigorous female infant in the vertex ROA presentation.  The baby had Apgar scores of 8 at 1 minute and 9 at 5 minutes.  Weight of 6 pounds 7.7 ounces, 2940 g. Following delivery, the infant was passed to the waiting maternal abdomen.  Following a 1 minute delay the cord was clamped and cut and the infant was passed to the waiting NICU team.  The placenta then spontaneously delivered, intact, with three-vessel cord.  No lacerations required repair.  Placenta to pathology due to P PROM.  EBL 237 cc.  All sponge, needle, instrument counts were correct.  Baby remains at bedside with mom.  Anesthesia: None Laceration: None EBL 237 cc Disposition Mom: Postpartum,  baby couplet care

## 2021-05-27 ENCOUNTER — Other Ambulatory Visit: Payer: Self-pay | Admitting: Urology

## 2021-05-27 DIAGNOSIS — R109 Unspecified abdominal pain: Secondary | ICD-10-CM

## 2021-05-27 DIAGNOSIS — N200 Calculus of kidney: Secondary | ICD-10-CM

## 2021-06-10 ENCOUNTER — Ambulatory Visit
Admission: RE | Admit: 2021-06-10 | Discharge: 2021-06-10 | Disposition: A | Discharge status: Home / Self Care | Payer: Self-pay | Source: Ambulatory Visit | Attending: Urology | Admitting: Urology

## 2021-06-10 DIAGNOSIS — N200 Calculus of kidney: Secondary | ICD-10-CM

## 2021-06-10 DIAGNOSIS — R109 Unspecified abdominal pain: Secondary | ICD-10-CM

## 2021-11-02 ENCOUNTER — Other Ambulatory Visit: Payer: Self-pay | Admitting: Obstetrics and Gynecology

## 2021-11-02 DIAGNOSIS — R109 Unspecified abdominal pain: Secondary | ICD-10-CM

## 2021-11-04 ENCOUNTER — Ambulatory Visit
Admission: RE | Admit: 2021-11-04 | Discharge: 2021-11-04 | Disposition: A | Discharge status: Home / Self Care | Payer: 59 | Source: Ambulatory Visit | Attending: Obstetrics and Gynecology | Admitting: Obstetrics and Gynecology

## 2021-11-04 DIAGNOSIS — R109 Unspecified abdominal pain: Secondary | ICD-10-CM

## 2021-11-27 ENCOUNTER — Other Ambulatory Visit: Payer: Self-pay

## 2021-11-27 ENCOUNTER — Encounter (HOSPITAL_COMMUNITY): Payer: Self-pay | Admitting: *Deleted

## 2021-11-27 ENCOUNTER — Inpatient Hospital Stay (HOSPITAL_COMMUNITY)
Admission: AD | Admit: 2021-11-27 | Discharge: 2021-11-29 | DRG: 806 | Disposition: A | Discharge status: Home / Self Care | Payer: 59 | Attending: Obstetrics and Gynecology | Admitting: Obstetrics and Gynecology

## 2021-11-27 DIAGNOSIS — O9952 Diseases of the respiratory system complicating childbirth: Secondary | ICD-10-CM | POA: Diagnosis present

## 2021-11-27 DIAGNOSIS — O9832 Other infections with a predominantly sexual mode of transmission complicating childbirth: Secondary | ICD-10-CM | POA: Diagnosis present

## 2021-11-27 DIAGNOSIS — Z87891 Personal history of nicotine dependence: Secondary | ICD-10-CM | POA: Diagnosis not present

## 2021-11-27 DIAGNOSIS — A6009 Herpesviral infection of other urogenital tract: Secondary | ICD-10-CM | POA: Diagnosis present

## 2021-11-27 DIAGNOSIS — A6000 Herpesviral infection of urogenital system, unspecified: Secondary | ICD-10-CM | POA: Diagnosis present

## 2021-11-27 DIAGNOSIS — Z3A35 35 weeks gestation of pregnancy: Secondary | ICD-10-CM | POA: Diagnosis not present

## 2021-11-27 DIAGNOSIS — J45909 Unspecified asthma, uncomplicated: Secondary | ICD-10-CM | POA: Diagnosis present

## 2021-11-27 DIAGNOSIS — O42913 Preterm premature rupture of membranes, unspecified as to length of time between rupture and onset of labor, third trimester: Principal | ICD-10-CM | POA: Diagnosis present

## 2021-11-27 DIAGNOSIS — Z6791 Unspecified blood type, Rh negative: Secondary | ICD-10-CM | POA: Diagnosis not present

## 2021-11-27 DIAGNOSIS — O26893 Other specified pregnancy related conditions, third trimester: Secondary | ICD-10-CM | POA: Diagnosis present

## 2021-11-27 DIAGNOSIS — Z3490 Encounter for supervision of normal pregnancy, unspecified, unspecified trimester: Principal | ICD-10-CM

## 2021-11-27 HISTORY — DX: Herpesviral infection, unspecified: B00.9

## 2021-11-27 LAB — CBC
HCT: 39.2 % (ref 36.0–46.0)
Hemoglobin: 12.3 g/dL (ref 12.0–15.0)
MCH: 27.2 pg (ref 26.0–34.0)
MCHC: 31.4 g/dL (ref 30.0–36.0)
MCV: 86.5 fL (ref 80.0–100.0)
Platelets: 250 10*3/uL (ref 150–400)
RBC: 4.53 MIL/uL (ref 3.87–5.11)
RDW: 13.5 % (ref 11.5–15.5)
WBC: 7.4 10*3/uL (ref 4.0–10.5)
nRBC: 0 % (ref 0.0–0.2)

## 2021-11-27 LAB — POCT FERN TEST

## 2021-11-27 MED ORDER — OXYCODONE-ACETAMINOPHEN 5-325 MG PO TABS: 1.0000 | ORAL_TABLET | ORAL | Status: DC | PRN

## 2021-11-27 MED ORDER — OXYTOCIN-SODIUM CHLORIDE 30-0.9 UT/500ML-% IV SOLN: 2.5000 [IU]/h | INTRAVENOUS | Status: DC | PRN

## 2021-11-27 MED ORDER — OXYCODONE HCL 5 MG PO TABS: 5.0000 mg | ORAL_TABLET | ORAL | Status: DC | PRN

## 2021-11-27 MED ORDER — SOD CITRATE-CITRIC ACID 500-334 MG/5ML PO SOLN: 30.0000 mL | ORAL | Status: DC | PRN

## 2021-11-27 MED ORDER — ZOLPIDEM TARTRATE 5 MG PO TABS: 5.0000 mg | ORAL_TABLET | Freq: Every evening | ORAL | Status: DC | PRN

## 2021-11-27 MED ORDER — ACETAMINOPHEN 325 MG PO TABS
650.0000 mg | ORAL_TABLET | ORAL | Status: DC | PRN
  Administered 2021-11-28 – 2021-11-29 (×3): 650 mg via ORAL
  Filled 2021-11-27 (×3): qty 2

## 2021-11-27 MED ORDER — OXYTOCIN 10 UNIT/ML IJ SOLN
INTRAMUSCULAR | Status: AC
  Filled 2021-11-27: qty 1

## 2021-11-27 MED ORDER — METHYLERGONOVINE MALEATE 0.2 MG PO TABS: 0.2000 mg | ORAL_TABLET | ORAL | Status: DC | PRN

## 2021-11-27 MED ORDER — OXYCODONE HCL 5 MG PO TABS: 10.0000 mg | ORAL_TABLET | ORAL | Status: DC | PRN

## 2021-11-27 MED ORDER — PRENATAL MULTIVITAMIN CH
1.0000 | ORAL_TABLET | Freq: Every day | ORAL | Status: DC
  Administered 2021-11-28 – 2021-11-29 (×2): 1 via ORAL
  Filled 2021-11-27 (×2): qty 1

## 2021-11-27 MED ORDER — SENNOSIDES-DOCUSATE SODIUM 8.6-50 MG PO TABS
2.0000 | ORAL_TABLET | Freq: Every day | ORAL | Status: DC
  Administered 2021-11-28: 2 via ORAL
  Filled 2021-11-27 (×2): qty 2

## 2021-11-27 MED ORDER — ONDANSETRON HCL 4 MG/2ML IJ SOLN
4.0000 mg | Freq: Four times a day (QID) | INTRAMUSCULAR | Status: DC | PRN
  Administered 2021-11-27: 4 mg via INTRAVENOUS
  Filled 2021-11-27: qty 2

## 2021-11-27 MED ORDER — ONDANSETRON HCL 4 MG PO TABS: 4.0000 mg | ORAL_TABLET | ORAL | Status: DC | PRN

## 2021-11-27 MED ORDER — DIBUCAINE (PERIANAL) 1 % EX OINT: 1.0000 | TOPICAL_OINTMENT | CUTANEOUS | Status: DC | PRN

## 2021-11-27 MED ORDER — FLEET ENEMA 7-19 GM/118ML RE ENEM: 1.0000 | ENEMA | RECTAL | Status: DC | PRN

## 2021-11-27 MED ORDER — TETANUS-DIPHTH-ACELL PERTUSSIS 5-2.5-18.5 LF-MCG/0.5 IM SUSY: 0.5000 mL | PREFILLED_SYRINGE | Freq: Once | INTRAMUSCULAR | Status: DC

## 2021-11-27 MED ORDER — OXYTOCIN-SODIUM CHLORIDE 30-0.9 UT/500ML-% IV SOLN
2.5000 [IU]/h | INTRAVENOUS | Status: DC
  Administered 2021-11-27: 2.5 [IU]/h via INTRAVENOUS
  Filled 2021-11-27: qty 500

## 2021-11-27 MED ORDER — ONDANSETRON HCL 4 MG/2ML IJ SOLN: 4.0000 mg | INTRAMUSCULAR | Status: DC | PRN

## 2021-11-27 MED ORDER — AMOXICILLIN 500 MG PO CAPS: 500.0000 mg | ORAL_CAPSULE | Freq: Three times a day (TID) | ORAL | Status: DC

## 2021-11-27 MED ORDER — DIPHENHYDRAMINE HCL 25 MG PO CAPS: 25.0000 mg | ORAL_CAPSULE | Freq: Four times a day (QID) | ORAL | Status: DC | PRN

## 2021-11-27 MED ORDER — BENZOCAINE-MENTHOL 20-0.5 % EX AERO: 1.0000 | INHALATION_SPRAY | CUTANEOUS | Status: DC | PRN

## 2021-11-27 MED ORDER — FENTANYL CITRATE (PF) 100 MCG/2ML IJ SOLN: 100.0000 ug | INTRAMUSCULAR | Status: DC | PRN

## 2021-11-27 MED ORDER — SODIUM CHLORIDE 0.9 % IV SOLN
2.0000 g | Freq: Four times a day (QID) | INTRAVENOUS | Status: DC
  Administered 2021-11-27: 2 g via INTRAVENOUS
  Filled 2021-11-27: qty 2000

## 2021-11-27 MED ORDER — LACTATED RINGERS IV SOLN: INTRAVENOUS | Status: DC

## 2021-11-27 MED ORDER — SIMETHICONE 80 MG PO CHEW: 80.0000 mg | CHEWABLE_TABLET | ORAL | Status: DC | PRN

## 2021-11-27 MED ORDER — COCONUT OIL OIL: 1.0000 | TOPICAL_OIL | Status: DC | PRN

## 2021-11-27 MED ORDER — OXYTOCIN 10 UNIT/ML IJ SOLN: 10.0000 [IU] | Freq: Once | INTRAMUSCULAR | Status: DC

## 2021-11-27 MED ORDER — WITCH HAZEL-GLYCERIN EX PADS: 1.0000 | MEDICATED_PAD | CUTANEOUS | Status: DC | PRN

## 2021-11-27 MED ORDER — BETAMETHASONE SOD PHOS & ACET 6 (3-3) MG/ML IJ SUSP
12.0000 mg | Freq: Once | INTRAMUSCULAR | Status: AC
  Administered 2021-11-27: 12 mg via INTRAMUSCULAR
  Filled 2021-11-27: qty 5

## 2021-11-27 MED ORDER — AZITHROMYCIN 500 MG PO TABS
1000.0000 mg | ORAL_TABLET | Freq: Once | ORAL | Status: AC
  Administered 2021-11-27: 1000 mg via ORAL
  Filled 2021-11-27: qty 2

## 2021-11-27 MED ORDER — IBUPROFEN 600 MG PO TABS
600.0000 mg | ORAL_TABLET | Freq: Four times a day (QID) | ORAL | Status: DC
  Administered 2021-11-28 – 2021-11-29 (×6): 600 mg via ORAL
  Filled 2021-11-27 (×8): qty 1

## 2021-11-27 MED ORDER — OXYCODONE-ACETAMINOPHEN 5-325 MG PO TABS: 2.0000 | ORAL_TABLET | ORAL | Status: DC | PRN

## 2021-11-27 MED ORDER — FENTANYL CITRATE (PF) 100 MCG/2ML IJ SOLN
INTRAMUSCULAR | Status: AC
  Administered 2021-11-27: 100 ug via INTRAVENOUS
  Filled 2021-11-27: qty 2

## 2021-11-27 MED ORDER — LACTATED RINGERS IV SOLN: 500.0000 mL | INTRAVENOUS | Status: DC | PRN

## 2021-11-27 MED ORDER — LIDOCAINE HCL (PF) 1 % IJ SOLN
30.0000 mL | INTRAMUSCULAR | Status: DC | PRN
Start: 2021-11-27 — End: 2021-11-27

## 2021-11-27 MED ORDER — ACETAMINOPHEN 325 MG PO TABS
650.0000 mg | ORAL_TABLET | ORAL | Status: DC | PRN
Start: 2021-11-27 — End: 2021-11-27

## 2021-11-27 MED ORDER — METHYLERGONOVINE MALEATE 0.2 MG/ML IJ SOLN: 0.2000 mg | INTRAMUSCULAR | Status: DC | PRN

## 2021-11-27 MED ORDER — VALACYCLOVIR HCL 500 MG PO TABS: 500.0000 mg | ORAL_TABLET | Freq: Two times a day (BID) | ORAL | Status: DC

## 2021-11-27 MED ORDER — OXYTOCIN BOLUS FROM INFUSION
333.0000 mL | Freq: Once | INTRAVENOUS | Status: AC
  Administered 2021-11-27: 333 mL via INTRAVENOUS

## 2021-11-27 NOTE — Consult Note (Signed)
[facility]  Consultation Service: Neonatology   Dr. Harrington Challenger has asked for consultation on Elizabeth Elizabeth [name] regarding the care of a premature infant at 35+4wk. Thank you for inviting [place] to see this patient.   Reason for consult:  Explain the possible complications, the prognosis, and the care of a premature infant at 40 and 4/7 weeks.  Chief complaint: 36 y.o. G3P1011 @ 35+4 wk, with spontaneous PPROM and sPTL.  Pregnancy has been fairly uncomplicated, B-, rhogam @ 28 wk.  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 GBS unk. She is being treated w/ amp/ erythro for latency. She received one dose BMZ 11/27/21. Plan is for delivery via vaginal delivery if possible.   My key findings of this patient's HPI are:  I have reviewed the patient's chart and have met with Elizabeth [name]. The salient information is as follows:   Mom is admitted to L&D for spnt preterm labor, PPROM, and is dilated to 6 cm with active contractions. Monitoring for fetal/maternal distress, no active concerns at this time. On latency antibiotics and continuous monitoring.    Prenatal labs:  ABO, Rh: B NEG (08/20 1200) Antibody: POS (08/20 1200) Rubella:  Immune RPR:   Reactive HBsAg:   Negative HIV:   Nonreactive GBS:   Unknown   Prenatal care:   good Pregnancy complications:  none Maternal antibiotics: This patient's mother is not on file. Maternal Steroids: BMZ x 1 11/27/21.  Most recent dose:  1617  My recommendations for this patient and my actions included:   1. In the presence of the Elizabeth Elizabeth [name] and the father of the baby, I spent 15 minutes discussing the possible complications and outcomes of prematurity at this gestational age. I discussed specific complications at this gestational age referencing the need for resuscitation at birth due to respiratory distress which may require mechanical ventilation, CPAP, and surfactant administration. In addition infant may require IV fluids pending establishment of  enteral feeds (encouraged breast milk feeding), antibiotics for possible sepsis, temperature support, and continuous monitoring.  2. I also discussed the expected survival of an infant born at 50+4 weeks, which is Very good and approaches infants born at term. Most infants at this gestational age do not have any long term complications.  3. I informed Elizabeth [name] that the NICU team would be present at the delivery. . She also understood that our team will always be available for any questions that come up prior to delivery.    Final Impression:  36 y.o. female with a 35+4 IUP who is in active labor and who now understands the excellent prognosis of Elizabeth [name] infant. She also understands their is a risk the baby may need to be admitted to NICU for temperature, blood sugar, feeding, respiratory support. Elizabeth Elizabeth [name]'s questions were answered. She is planning to try and provide breast milk for Elizabeth [name] infant, open for formula feeding.    ______________________________________________________________________  Thank you for asking [place] to participate in the care of this patient. Please do not hesitate to contact us again if you are aware of any further ways we can beof assistance.   Sincerely,  Denna Haggard, MD Attending Neonatologist   I spent ~30 minutes in consultation time, of which 15 minutes was spent in direct face to face counseling.

## 2021-11-27 NOTE — MAU Note (Signed)
Elizabeth [name] is a 36 y.o. at [redacted]w[redacted]d here in MAU reporting: gush of fluid at 0915, clear fluid, still coming. No pain.some crampy/pressure near her ovaries.   Does not know if she is bleeding. Reports +FM.   Onset of complaint: 0915 Pain score: none Vitals:   11/27/21 1009  BP: 115/84  Pulse: 96  Resp: 18  Temp: 98.2 F (36.8 C)  SpO2: 100%     FHT:160 Lab orders placed from triage:  fern

## 2021-11-27 NOTE — Lactation Note (Signed)
This note was copied from a baby's chart. Lactation Consultation Note  Patient Name: Elizabeth [name] [id number] Date: 11/27/2021 Reason for consult: L&D Initial assessment Age:34 hours, LPTI Birth Parent latched infant on the left breast using the football hold position, infant sustained latch and BF for 16 minutes. Afterwards,  LC used breast model and Birth Parent was taught how to self express, infant given 1 ml of colostrum by spoon. Birth Parent was doing STS with infant when LC left the room. Birth Parent knows to continue to BF infant 8 to 12+ times within 24 hours, STS, LPTI feeding policy will be discussed on MBU. Birth Parent knows to call RN/LC for further latch assistance on MBU if needed. Maternal Data Has patient been taught Hand Expression?: Yes Does the patient have breastfeeding experience prior to this delivery?: Yes How long did the patient breastfeed?: Birth Parent, BF 1st child for 3 months stopped when returning to work, 1st child is 35 years old now.  Feeding Mother's Current Feeding Choice: Breast Milk and Formula  LATCH Score Latch: Grasps breast easily, tongue down, lips flanged, rhythmical sucking.  Audible Swallowing: A few with stimulation  Type of Nipple: Everted at rest and after stimulation  Comfort (Breast/Nipple): Soft / non-tender  Hold (Positioning): Assistance needed to correctly position infant at breast and maintain latch.  LATCH Score: 8   Lactation Tools Discussed/Used    Interventions Interventions: Assisted with latch;Skin to skin;Breast compression;Hand express;Adjust position;Support pillows;Position options;Expressed milk;Education  Discharge Pump: Personal (Birth Parent brought Motif DEBP from home.)  Consult Status Consult Status: Follow-up from L&D    Danelle Earthly 11/27/2021, 10:50 PM

## 2021-11-27 NOTE — MAU Provider Note (Signed)
History     CSN: [id number]  Arrival date and time: 11/27/21 2025   None     Chief Complaint  Patient presents with   Rupture of Membranes   HPI Elizabeth [name] is a 36 y.o. G3P1011 at [redacted]w[redacted]d who presents to MAU for ruptured membranes. 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 Fluid is clear and continued to trickle out. She denies contractions, but reports feeling some mild lower abdominal pressure. No vaginal bleeding. Endorses active fetal movement.   Patient receives prenatal care at [facility].   OB History     Gravida  3   Para  1   Term  1   Preterm  0   AB  0   Living  1      SAB  0   IAB  0   Ectopic  0   Multiple  0   Live Births              Past Medical History:  Diagnosis Date   Anemia    low iron on no meds   Asthma    Bacterial vaginosis    Bartholin cyst    Blood dyscrasia    Rh negative    Chronic kidney disease    left ureteral stone    HSV (herpes simplex virus) infection    Recurrent upper respiratory infection (URI)    01/09/11 URI - no problems now    Trichomonas     Past Surgical History:  Procedure Laterality Date   CYSTOSCOPY/RETROGRADE/URETEROSCOPY  02/23/2011   Procedure: CYSTOSCOPY/RETROGRADE/URETEROSCOPY;  Surgeon: Crecencio Mc, MD;  Location: [facility];  Service: Urology;  Laterality: Left;  CYSTOSCOPY, LEFT RETROGRADE PYLEOGRADE//LEFT URETEROSCOPY LASER LITHOTRIPSY, LEFT URETERAL STENT  PLACEMENT   INDUCED ABORTION     NO PAST SURGERIES     no previous surgeries     No family history on file.  Social History   Tobacco Use   Smoking status: Former    Types: Cigarettes    Quit date: 01/08/2009    Years since quitting: 12.8   Smokeless tobacco: Never  Substance Use Topics   Alcohol use: No   Drug use: No    Allergies: No Known Allergies  Medications Prior to Admission  Medication Sig Dispense Refill  Last Dose   albuterol (VENTOLIN HFA) 108 (90 Base) MCG/ACT inhaler Inhale 2 puffs into the lungs every 6 (six) hours as needed for wheezing or shortness of breath. 18 g 0    etonogestrel (NEXPLANON) 68 MG IMPL implant 1 each by Subdermal route once.      fluconazole (DIFLUCAN) 150 MG tablet Take 1 tablet (150 mg total) by mouth daily. Take second dose 72 hours later if symptoms still persists. 2 tablet 0    levocetirizine (XYZAL) 5 MG tablet Take 1 tablet (5 mg total) by mouth every evening. 30 tablet 0    metroNIDAZOLE (FLAGYL) 500 MG tablet Take 1 tablet (500 mg total) by mouth 2 (two) times daily. 14 tablet 0     Review of Systems  Constitutional: Negative.   Respiratory: Negative.    Gastrointestinal: Negative.   Genitourinary:  Positive for vaginal discharge. Negative for dysuria.  Musculoskeletal: Negative.   Neurological: Negative.    Physical Exam   Patient Vitals for the past 24 hrs:  BP Temp Temp src Pulse Resp SpO2 Height Weight  11/27/21 1009  115/84 98.2 F (36.8 C) Oral 96 18 100 % 5' (1.524 m) 72.5 kg   Physical Exam Vitals and nursing note reviewed.  Constitutional:      General: She is not in acute distress. Eyes:     Extraocular Movements: Extraocular movements intact.     Pupils: Pupils are equal, round, and reactive to light.  Cardiovascular:     Rate and Rhythm: Normal rate.  Pulmonary:     Effort: Pulmonary effort is normal.  Abdominal:     Palpations: Abdomen is soft.     Tenderness: There is no abdominal tenderness.     Comments: Gravid   Genitourinary:    Comments: Grossly ruptured, copious amounts of clear amniotic fluid on pad. Fern slide positive.  Cervical exam deferred given patient not contracting frequently.  Musculoskeletal:        General: Normal range of motion.     Cervical back: Normal range of motion.  Skin:    General: Skin is warm and dry.  Neurological:     General: No focal deficit present.     Mental Status: She is alert and  oriented to person, place, and time.  Psychiatric:        Mood and Affect: Mood normal.        Behavior: Behavior normal.        Judgment: Judgment normal.    NST FHR: 145 bpm, moderate variability, +15x15 accels, 1 late decel Toco: occasional with ui  Fern slide: positive  MAU Course  Procedures  MDM Patiently grossly ruptured, fern slide positive. Patient not contracting frequently and unaware of the few contractions she has had so cervical exam deferred. Vertex by BSUS.   11:10 Dr. Tenny Craw notified of patient arrival and findings. Given NICU acuity she will call NICU to see if patient can be admitted to L&D here or if patient needs to be transferred  11:14 Dr. Tenny Craw called back reporting NICU will let patient stay here for delivery  Assessment and Plan  [redacted] weeks gestation of pregnancy Preterm premature rupture of membranes  - Admit to L&D - Dr. Tenny Craw to place orders and assume care of patient   Brand Males, CNM 11/27/2021, 12:16 PM

## 2021-11-27 NOTE — H&P (Signed)
Elizabeth [name] is a 36 y.o. female presenting for leaking fluid  36 year old G3 P1-0-1-1 at 35+4 weeks presents complaining of leaking of fluid and was confirmed to have spontaneously ruptured membranes.  Otherwise, her pregnancy has been uncomplicated.  She is B- and received RhoGAM at 28 weeks.  She has a history of genital herpes and has not yet started Valtrex suppression.  No recent outbreaks.  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    Chronic kidney disease    left ureteral stone    HSV (herpes simplex virus) infection    Recurrent upper respiratory infection (URI)    01/09/11 URI - no problems now    Trichomonas    Past Surgical History:  Procedure Laterality Date   CYSTOSCOPY/RETROGRADE/URETEROSCOPY  02/23/2011   Procedure: CYSTOSCOPY/RETROGRADE/URETEROSCOPY;  Surgeon: Crecencio Mc, MD;  Location: [facility];  Service: Urology;  Laterality: Left;  CYSTOSCOPY, LEFT RETROGRADE PYLEOGRADE//LEFT URETEROSCOPY LASER LITHOTRIPSY, LEFT URETERAL STENT  PLACEMENT   INDUCED ABORTION     NO PAST SURGERIES     no previous surgeries    Family History: family history is not on file. Social History:  reports that she quit smoking about 7 months ago. Her smoking use included cigarettes. She has never used smokeless tobacco. She reports that she does not drink alcohol and does not use drugs.     Maternal Diabetes: No Genetic Screening: Normal Maternal Ultrasounds/Referrals: Normal Fetal Ultrasounds or other Referrals:  None Maternal Substance Abuse:  No Significant Maternal Medications:  None Significant Maternal Lab Results:  Rh negative Number of Prenatal Visits:greater than 3 verified prenatal  visits Other Comments:  None  Review of Systems History Dilation: 1 Effacement (%): Thick Station: -3 Exam by:: Dr Tenny Craw Blood pressure 114/85, pulse 96, temperature 98.2 F (36.8 C), temperature source Oral, resp. rate 17, height 5' (1.524 m), weight 72.5 kg, SpO2 100 %. Exam Physical Exam  Prenatal labs: ABO, Rh: --/--/B NEG (08/20 1200) Antibody: POS (08/20 1200) Rubella:  Immune RPR:   Reactive HBsAg:   Negative HIV:   Nonreactive GBS:   Unknown  Results for orders placed or performed during the hospital encounter of 11/27/21 (from the past 24 hour(s))  Type and screen [facility]     Status: None   Collection Time: 11/27/21 12:00 PM  Result Value Ref Range   ABO/RH(D) B NEG    Antibody Screen POS    Sample Expiration 11/30/2021,2359    Antibody Identification      PASSIVELY ACQUIRED ANTI-D Performed at [facility], [address] [place], [place] [address]   CBC     Status: None   Collection Time: 11/27/21 12:59 PM  Result Value Ref Range   WBC 7.4 4.0 - 10.5 K/uL   RBC 4.53 3.87 - 5.11 MIL/uL   Hemoglobin 12.3 12.0 - 15.0 g/dL   HCT 39.2  36.0 - 46.0 %   MCV 86.5 80.0 - 100.0 fL   MCH 27.2 26.0 - 34.0 pg   MCHC 31.4 30.0 - 36.0 g/dL   RDW 70.3 50.0 - 93.8 %   Platelets 250 150 - 400 K/uL   nRBC 0.0 0.0 - 0.2 %  Fern Test     Status: Abnormal   Collection Time: 11/27/21  1:25 PM  Result Value Ref Range   POCT Fern Test       Assessment/Plan: 36 year old G3 P1-0-1-1 at 35+4 with confirmed P PROM  1) admit 2) P PROM: Discussed with patient and her partner that traditionally management of P PROM after 34 weeks has been to proceed with augmentation of labor.  In recent years, some studies have supported expectant management and pregnancies experiencing P PROM between 34+0 and 36+6.  Risks/benefits/alternatives of augmentation of labor versus expectant management were discussed at length.  Patient and partner are considering their  options.  Discussed with patient and partner that if labor were to ensue then we would proceed with augmentation and delivery.  If there was any evidence of nonreassuring fetal status or infection we would also proceed with augmentation of labor. 3) ampicillin and erythromycin IV for latency 4) betamethasone, if remains undelivered in 24 hours we will repeat the dose 5) NICU consultation   Waynard Reeds 11/27/2021, 5:01 PM

## 2021-11-28 LAB — CBC
HCT: 34.5 % — ABNORMAL LOW (ref 36.0–46.0)
Hemoglobin: 11.4 g/dL — ABNORMAL LOW (ref 12.0–15.0)
MCH: 27.7 pg (ref 26.0–34.0)
MCHC: 33 g/dL (ref 30.0–36.0)
MCV: 83.7 fL (ref 80.0–100.0)
Platelets: 218 10*3/uL (ref 150–400)
RBC: 4.12 MIL/uL (ref 3.87–5.11)
RDW: 13.4 % (ref 11.5–15.5)
WBC: 16.2 10*3/uL — ABNORMAL HIGH (ref 4.0–10.5)
nRBC: 0 % (ref 0.0–0.2)

## 2021-11-28 LAB — TYPE AND SCREEN
ABO/RH(D): B NEG
Antibody Screen: POSITIVE

## 2021-11-28 LAB — RPR: RPR Ser Ql: NONREACTIVE

## 2021-11-28 MED ORDER — RHO D IMMUNE GLOBULIN 1500 UNIT/2ML IJ SOSY
300.0000 ug | PREFILLED_SYRINGE | Freq: Once | INTRAMUSCULAR | Status: AC
Start: 2021-11-28 — End: 2021-11-28
  Administered 2021-11-28: 300 ug via INTRAVENOUS
  Filled 2021-11-28: qty 2

## 2021-11-28 NOTE — Progress Notes (Signed)
Post Partum Day 1 Subjective: no complaints, up ad lib, and tolerating PO  Objective: Blood pressure 105/63, pulse 68, temperature 98 F (36.7 C), temperature source Oral, resp. rate 18, height 5' (1.524 m), weight 72.5 kg, SpO2 100 %, unknown if currently breastfeeding.  Physical Exam:  General: alert and cooperative Lochia: appropriate Uterine Fundus: firm   Recent Labs    11/27/21 1259 11/28/21 0426  HGB 12.3 11.4*  HCT 39.2 34.5*    Assessment/Plan: Plan for discharge tomorrow   LOS: 1 day   Oliver Pila, MD 11/28/2021, 12:18 PM

## 2021-11-28 NOTE — Lactation Note (Signed)
This note was copied from a baby's chart. Lactation Consultation Note  Patient Name: Elizabeth [name] [id number] Date: 11/28/2021 Reason for consult: Initial assessment;Late-preterm 34-36.6wks Age:34 hours   P2: Late preterm infant at 35+4 weeks with a CGA of 35+5 weeks Feeding preference: Breast/formula  NT weighed baby; birth parent had "[name]" latched when I arrived.  Intermittent clicking sound noted with sucking.  Assessed oral cavity and "[name]" was sucking on her tongue.  Suck training performed; strong suck noted.  Assisted to latch more deeply in the football hold.  She eagerly began sucking; birth parent denied pain.  Occasional clicking still noted during sucking, however, much less.  Observed her feeding for 15 minutes.    Offered to initiate the electric pump for breast stimulation; birth parent receptive.  Pump parts, assembly and cleaning reviewed.  #24 flange is appropriate at this time.  Birth parent became extremely sleepy during pumping; suggested she awaken father if "[name]" needs attention so she can rest.  No colostrum drops noted after pumping.  Demonstrated finger feeding when drops are available.  Reviewed LPTI policy, but would suggest reviewing again due to sleepiness if birth parent has questions.  Discussed possible supplementation for "Camille."  Volumes reviewed. Encouraged to feed at least every three hours or sooner if "[name]" shows cues.  Suggested she call her RN/LC for latch assistance.  RN updated.   Maternal Data Has patient been taught Hand Expression?: Yes Does the patient have breastfeeding experience prior to this delivery?: Yes How long did the patient breastfeed?: 3 months  Feeding Mother's Current Feeding Choice: Breast Milk and Formula  LATCH Score Latch: Grasps breast easily, tongue down, lips flanged, rhythmical sucking.  Audible Swallowing: A few with stimulation  Type of Nipple: Everted at rest and after stimulation  Comfort  (Breast/Nipple): Soft / non-tender  Hold (Positioning): Assistance needed to correctly position infant at breast and maintain latch.  LATCH Score: 8   Lactation Tools Discussed/Used Tools: Pump;Flanges Flange Size: 24 Breast pump type: Double-Electric Breast Pump Pump Education: Setup, frequency, and cleaning;Milk Storage Reason for Pumping: Breast stimulation for supplementation Pumping frequency: Every three hours  Interventions Interventions: Breast feeding basics reviewed;Assisted with latch;Skin to skin;Breast massage;Hand express;Breast compression;Hand pump;Position options;Support pillows;Adjust position;DEBP;Education;LC Services brochure;LPT handout/interventions  Discharge Pump: Personal (Motif)  Consult Status Consult Status: Follow-up Date: 11/29/21 Follow-up type: In-patient    Nettye Flegal R Cyleigh Massaro 11/28/2021, 6:20 AM

## 2021-11-29 LAB — RH IG WORKUP (INCLUDES ABO/RH)
Fetal Screen: NEGATIVE
Gestational Age(Wks): 35.4
Unit division: 0

## 2021-11-29 NOTE — Discharge Summary (Signed)
Postpartum Discharge Summary    Patient Name: Elizabeth [name] DOB: 01/06/1986 MRN: [id number]  Date of admission: 11/27/2021 Delivery date:11/27/2021  Delivering provider: Waynard Reeds  Date of discharge: 11/29/2021  Admitting diagnosis: Pregnancy [Z34.90] Spontaneous vaginal delivery [O80] Intrauterine pregnancy: [redacted]w[redacted]d     Secondary diagnosis:  Principal Problem:   Pregnancy Active Problems:   Spontaneous vaginal delivery  Additional problems: genital HSV not on valtrex, but asymptomatic    Discharge diagnosis: Preterm Pregnancy Delivered                                              Post partum procedures: none Augmentation: N/A Complications: None  Hospital course: Onset of Labor With Vaginal Delivery      36 y.o. yo [id number] at [redacted]w[redacted]d was admitted in Active Labor on 11/27/2021. Patient had an uncomplicated labor course as follows:  Membrane Rupture Time/Date: 9:15 AM ,11/27/2021   Delivery Method:Vaginal, Spontaneous  Episiotomy: None  Lacerations:  None  Patient had an uncomplicated postpartum course.  She is ambulating, tolerating a regular diet, passing flatus, and urinating well. Patient is discharged home in stable condition on 11/29/21.  Newborn Data: Birth date:11/27/2021  Birth time:9:04 PM  Gender:Female  Living status:Living  Apgars:8 ,9  Weight:2940 g   Magnesium Sulfate received: No BMZ received: No Transfusion:No  Physical exam  Vitals:   11/28/21 1000 11/28/21 1400 11/28/21 2205 11/29/21 0556  BP: 105/63 104/68 100/71 109/66  Pulse: 68 71 69 68  Resp: 18 18 18 18   Temp: 98 F (36.7 C) 98.2 F (36.8 C) 98.3 F (36.8 C) 98.3 F (36.8 C)  TempSrc: Oral Oral Oral Oral  SpO2:   99% 100%  Weight:      Height:       General: alert, cooperative, and no distress Lochia: appropriate Uterine Fundus: firm Incision: N/A DVT Evaluation: No evidence of DVT seen on physical exam. Labs: Lab Results  Component Value Date   WBC 16.2 (H) 11/28/2021   HGB  11.4 (L) 11/28/2021   HCT 34.5 (L) 11/28/2021   MCV 83.7 11/28/2021   PLT 218 11/28/2021      Latest Ref Rng & Units 06/01/2019    3:47 PM  CMP  Glucose 70 - 99 mg/dL 81   BUN 6 - 20 mg/dL 16   Creatinine 06/03/2019 - 1.00 mg/dL 6.60   Sodium 6.30 - 160 mmol/L 137   Potassium 3.5 - 5.1 mmol/L 3.8   Chloride 98 - 111 mmol/L 105   CO2 22 - 32 mmol/L 23   Calcium 8.9 - 10.3 mg/dL 9.2   Total Protein 6.5 - 8.1 g/dL 6.7   Total Bilirubin 0.3 - 1.2 mg/dL 0.4   Alkaline Phos 38 - 126 U/L 51   AST 15 - 41 U/L 19   ALT 0 - 44 U/L 15    Edinburgh Score:     No data to display            After visit meds:  Allergies as of 11/29/2021   No Known Allergies      Medication List     STOP taking these medications    etonogestrel 68 MG Impl implant Commonly known as: NEXPLANON   fluconazole 150 MG tablet Commonly known as: Diflucan   levocetirizine 5 MG tablet Commonly known as: Xyzal   metroNIDAZOLE 500 MG tablet  Commonly known as: FLAGYL       TAKE these medications    albuterol 108 (90 Base) MCG/ACT inhaler Commonly known as: VENTOLIN HFA Inhale 2 puffs into the lungs every 6 (six) hours as needed for wheezing or shortness of breath.   cetirizine 10 MG tablet Commonly known as: ZYRTEC Take 10 mg by mouth daily.   prenatal multivitamin Tabs tablet Take 1 tablet by mouth daily at 12 noon.         Discharge home in stable condition Infant Feeding:  see peds note Infant Disposition:rooming in Discharge instruction: per After Visit Summary and Postpartum booklet. Activity: Advance as tolerated. Pelvic rest for 6 weeks.  Diet: routine diet Anticipated Birth Control: Unsure Postpartum Appointment:4 weeks Additional Postpartum F/U: Postpartum Depression checkup Future Appointments:No future appointments. Follow up Visit:  Follow-up Information     Waynard Reeds, MD Follow up in 4 week(s).   Specialty: Obstetrics and Gynecology Contact information: [address] [place] [address] [phone]                     11/29/2021 Philip Aspen, DO

## 2021-11-29 NOTE — Lactation Note (Signed)
This note was copied from a baby's chart. Lactation Consultation Note  Patient Name: Elizabeth [name] VCBSW'H Date: 11/29/2021 Reason for consult: Follow-up assessment;Infant < 6lbs;Late-preterm 34-36.6wks Age:34 hours  LC in to visit with P2 Mom of LPTI.  Baby is at a 7.6% weight loss, with several voids and 1 stool since birth.  Baby's bilirubin level trending up and placed on single phototherapy last evening.  Mom states baby just fed for 30 mins, some nipple soreness with latch.  Baby has been  more sleepy at the breast today per Mom.  Assisted with double pumping, flanges changed to 21 mm flanges for a better fit.  Mom expressed 3 ml which LC spoon fed to baby.  Mom agreed to using donor breast milk as a supplement.  Demonstrated how to pace bottle feed baby.  Baby took 15 ml easily with no spillage, gagging or spitting.    Let baby being held by [facility].  Plan recommended- 1- At least every 3 hrs, sooner if baby is hungry, offer breast asking for help with latch prn 2-supplement baby with EBM+/donor breast milk by paced bottle/spoon after breastfeeding 3-pump both breasts on initiation setting  Mom is interested in OP lactation F/U and will send referral on day of discharge.   Lactation Tools Discussed/Used Tools: Pump;Flanges;Bottle Flange Size: 21 Breast pump type: Double-Electric Breast Pump Pump Education: Setup, frequency, and cleaning Reason for Pumping: Support milk supply/LPTI/hyperbilirubinemia Pumping frequency: Mom has pumped once, encouraged to pump after each feeding Pumped volume: 3 mL  Interventions Interventions: Breast feeding basics reviewed;Skin to skin;Breast massage;Hand express;DEBP;Pace feeding  Discharge Discharge Education: Outpatient recommendation Pump: Personal Doy Mince Motif)  Consult Status Consult Status: Follow-up Date: 11/30/21 Follow-up type: In-patient    Judee Clara 11/29/2021, 10:39 AM

## 2021-11-30 ENCOUNTER — Ambulatory Visit: Payer: Self-pay

## 2021-11-30 LAB — SURGICAL PATHOLOGY

## 2021-11-30 NOTE — Lactation Note (Signed)
This note was copied from a baby's chart. Lactation Consultation Note  Patient Name: Elizabeth [name] KFMMC'R Date: 11/30/2021 Reason for consult: Follow-up assessment;Late-preterm 34-36.6wks Age:34 hours, LPTI, -8 % weight loss, infant is BF and supplemented with donor breast milk. Per Birth Parent, infant is no longer on phototherapy. Birth Parent, she  feels infant is latching well now and clicking noise is resolved and infant latch is deeper. Infant is BF for 10 to 15 minutes most feedings and then supplemented with EBM and then donor BM. Birth Parent is now pumping 15 mls per pumping session. LC did not see latch at this time, Birth Parent is sleepy, infant was given 15 mls of EBM and 1 hour later infant was still cuing and given 20 mls of donor milk. Birth Parent  feeding plan: 1- Continue to follow LPTI feeding plan BF infant 8x within 24 hours, STS and limit total feedings to 30 minutes or less. 2- Birth Parent will latch infant 15 minutes or less at the breast each feeding, then supplement with Pumped EBM and then offer donor breast milk during a feeding. 3 -Day 3 Birth Parent will supplement infant after latching at the breast with 20- 30 + mls of EBM/Donor milk per feeding. 4- Birth Parent will continue to use DEBP every 3 hours for 15 minutes on initial setting.  Maternal Data    Feeding Mother's Current Feeding Choice: Breast Milk and Donor Milk Nipple Type: Extra Slow Flow  LATCH Score                    Lactation Tools Discussed/Used    Interventions Interventions: Skin to skin;Position options;Education;Pace feeding  Discharge    Consult Status Consult Status: Follow-up Date: 12/01/21 Follow-up type: In-patient    Danelle Earthly 11/30/2021, 3:26 PM

## 2021-12-01 ENCOUNTER — Ambulatory Visit: Payer: Self-pay

## 2021-12-01 NOTE — Lactation Note (Addendum)
This note was copied from a baby's chart. Lactation Consultation Note  Patient Name: Elizabeth [name] AYTKZ'S Date: 12/01/2021 Reason for consult: Follow-up assessment;Mother's request;Late-preterm 34-36.6wks Age:34 days Infant is not longer on phototherapy. Per Birth Parent, infant's stools are now yellow and seedy in color/ appearance. Change in weight -1% from 66 hours to 96 hours, infant total weight loss on (day 4 )-9%.  Birth Parent latched infant on the left breast using the football hold position, infant BF for 8 minutes,  Per Birth Parent,  most feedings are 15 minutes in length at the breast and then infant is supplemented with EBM 1st and then donor breast milk per feeding.  After infant was BF for 8 minutes, infant given 20 mls of EBM and 20 mls of donor breast milk with this feeding total intake was 40 mls. LC informed no longer donor breast milk available tonight , Birth Parent will supplement infant with 22 kcal Neosure with iron after offering EBM first. Birth Parent feeding plan: 1- Continue to follow LPTI feeding policy, BF 8+ times within 24 hours, STS, limit total feedings to 30 minutes or less . 2- Continue to use DEBP every 3 hours, and give infant back EBM first before offering formula each feeding. 3- Base on infant's age day 4 after latching infant at breast  to supplement infant with  40-60 mls per feeding, more if infant wants it.  4- Birth Parent will continue to supplement infant to help stabilize weight loss. Maternal Data    Feeding Mother's Current Feeding Choice: Breast Milk and Donor Milk Nipple Type: Slow - flow  LATCH Score Latch: Grasps breast easily, tongue down, lips flanged, rhythmical sucking.  Audible Swallowing: A few with stimulation  Type of Nipple: Everted at rest and after stimulation  Comfort (Breast/Nipple): Soft / non-tender  Hold (Positioning): Assistance needed to correctly position infant at breast and maintain latch.  LATCH  Score: 8   Lactation Tools Discussed/Used Breast pump type: Double-Electric Breast Pump Pumping frequency: Birth Parent is pumping every 3 hours for 15 minutes on inital setting. Pumped volume: 20 mL  Interventions Interventions: Skin to skin;Assisted with latch;Breast compression;Adjust position;Support pillows;Position options;Expressed milk;DEBP;Pace feeding;Education  Discharge    Consult Status Consult Status: Follow-up Date: 12/02/21 Follow-up type: In-patient    Danelle Earthly 12/01/2021, 10:55 PM

## 2021-12-03 ENCOUNTER — Ambulatory Visit: Payer: Self-pay

## 2021-12-03 NOTE — Lactation Note (Signed)
This note was copied from a baby's chart. Lactation Consultation Note  Patient Name: Elizabeth [name] Date: 12/03/2021 Reason for consult: Follow-up assessment;Late-preterm 34-36.6wks;Infant weight loss;Infant < 6lbs Age:34 days  8.3% weight loss, bilirubin level dropped to 12.7 this am.   LC in to visit with P2 Mom of LPTI.  Baby lost 15 gms from yesterday and has been supplemented with 22 cal Neosure.  Baby has been taking 10 ml of EBM and 10 ml of 22 cal formula after breast feeding for 10 mins per breast.  Mom aware of not to let baby feed longer than 10 mins per breast, watching for milk transfer/suck pattern/swallows. RN gave baby a 9 latch score this am.   SLP was just here to consult and Baby had fed at the breast for 20 mins total and baby took 12 ml of formula.    Talked about how to manage her feeding plan at home.  1- STS with baby offering the breast at least every 3 hrs, deep latch and watching for active breastfeeding, limit to 10 mins per breast. 2-Hand baby to Support Person to offer supplement.  Goal is increase volume to 30-60 ml 22 cal formula (asked about fortifying EBM, messaged Dr. Sheliah Hatch) 3- Pump both breasts 15-30 mins  4- F/U lactation OP appt.  Mom encouraged to call prn for concerns. Engorgement prevention and treatment reviewed.  LATCH Score Latch: Repeated attempts needed to sustain latch, nipple held in mouth throughout feeding, stimulation needed to elicit sucking reflex.  Audible Swallowing: Spontaneous and intermittent  Type of Nipple: Everted at rest and after stimulation  Comfort (Breast/Nipple): Soft / non-tender  Hold (Positioning): No assistance needed to correctly position infant at breast.  LATCH Score: 9   Lactation Tools Discussed/Used Tools: Pump;Flanges;Bottle Breast pump type: Double-Electric Breast Pump;Manual Pump Education: Setup, frequency, and cleaning;Milk Storage Reason for Pumping: support milk supply Pumping  frequency: Encouraged Mom to pump after breastfeeding to support a full milk supply Pumped volume: 40 mL  Interventions Interventions: Breast feeding basics reviewed;Skin to skin;Breast massage;Hand express;Hand pump;DEBP;Coconut oil;Pace feeding  Discharge Discharge Education: Outpatient recommendation;Outpatient Epic message sent  Consult Status Consult Status: Complete Date: 12/03/21 Follow-up type: Out-patient    Judee Clara 12/03/2021, 10:56 AM

## 2021-12-03 NOTE — Lactation Note (Addendum)
This note was copied from a baby's chart. Lactation Consultation Note  Patient Name: Elizabeth [name] KGYJE'H Date: 12/03/2021   Age:34 days, LPTI, -8% weight loss seen by SLP. Per Birth parent recently BF infant 30 minutes prior to [facility] entering the room, LC did not observe latch. Birth Parent was in the process of feeding infant 27 mls of EBM using Dr. Irving Burton ultra Preemie nipple and afterwards will give Similac Neosure 22 kcal formula. LC reviewed LPTI feeding policy, encouraging Birth Parent  not  to feed infant longer than 30 minutes  with  combination of breast and  EBM/Formula supplementation to reserve infant's energy expeditor and stabilize weight.   LC discussed limit feedings at breast as advised earlier by SLP, the supplement with pumped EBM / formula based on infant's age supplement amount should be 50 to 60 mls+ per feeding, offer more infant wants it.  Maternal Data    Feeding    LATCH Score                    Lactation Tools Discussed/Used    Interventions    Discharge    Consult Status      Danelle Earthly 12/03/2021, 2:00 AM

## 2021-12-06 ENCOUNTER — Telehealth (HOSPITAL_COMMUNITY): Payer: Self-pay | Admitting: *Deleted

## 2021-12-06 NOTE — Telephone Encounter (Signed)
Hospital Discharge Follow-Up Call:  Patient reports that she is doing well and has no concerns about her healing process.  EPDS today was 2 and she endorses this accurately reflects that she is doing well emotionally.  Patient says that baby is well and she has no concerns about baby's health.  She reports that baby sleeps in a crib.  Reviewed ABCs of Safe Sleep.

## 2022-01-02 ENCOUNTER — Other Ambulatory Visit (HOSPITAL_COMMUNITY): Payer: Self-pay

## 2023-01-03 ENCOUNTER — Emergency Department (HOSPITAL_COMMUNITY)
Admission: EM | Admit: 2023-01-03 | Discharge: 2023-01-04 | Disposition: A | Discharge status: Home / Self Care | Payer: Medicaid Other | Attending: Emergency Medicine | Admitting: Emergency Medicine

## 2023-01-03 ENCOUNTER — Emergency Department (HOSPITAL_COMMUNITY): Payer: Medicaid Other

## 2023-01-03 DIAGNOSIS — R109 Unspecified abdominal pain: Secondary | ICD-10-CM | POA: Diagnosis present

## 2023-01-03 DIAGNOSIS — N189 Chronic kidney disease, unspecified: Secondary | ICD-10-CM | POA: Insufficient documentation

## 2023-01-03 DIAGNOSIS — N132 Hydronephrosis with renal and ureteral calculous obstruction: Secondary | ICD-10-CM | POA: Diagnosis not present

## 2023-01-03 DIAGNOSIS — J45909 Unspecified asthma, uncomplicated: Secondary | ICD-10-CM | POA: Diagnosis not present

## 2023-01-03 DIAGNOSIS — N200 Calculus of kidney: Secondary | ICD-10-CM

## 2023-01-03 DIAGNOSIS — D631 Anemia in chronic kidney disease: Secondary | ICD-10-CM | POA: Insufficient documentation

## 2023-01-03 LAB — URINALYSIS, ROUTINE W REFLEX MICROSCOPIC
Bacteria, UA: NONE SEEN
Bilirubin Urine: NEGATIVE
Glucose, UA: NEGATIVE mg/dL
Ketones, ur: NEGATIVE mg/dL
Nitrite: NEGATIVE
Protein, ur: NEGATIVE mg/dL
RBC / HPF: >50 RBC/hpf (ref 0–5)
Specific Gravity, Urine: 1.02 (ref 1.005–1.030)
WBC, UA: >50 WBC/hpf (ref 0–5)
pH: 7 (ref 5.0–8.0)

## 2023-01-03 LAB — CBC WITH DIFFERENTIAL/PLATELET
Abs Immature Granulocytes: 0.04 10*3/uL (ref 0.00–0.07)
Basophils Absolute: 0.1 10*3/uL (ref 0.0–0.1)
Basophils Relative: 1 %
Eosinophils Absolute: 0.2 10*3/uL (ref 0.0–0.5)
Eosinophils Relative: 2 %
HCT: 42.4 % (ref 36.0–46.0)
Hemoglobin: 13.4 g/dL (ref 12.0–15.0)
Immature Granulocytes: 1 %
Lymphocytes Relative: 30 %
Lymphs Abs: 2.6 10*3/uL (ref 0.7–4.0)
MCH: 27.7 pg (ref 26.0–34.0)
MCHC: 31.6 g/dL (ref 30.0–36.0)
MCV: 87.6 fL (ref 80.0–100.0)
Monocytes Absolute: 0.6 10*3/uL (ref 0.1–1.0)
Monocytes Relative: 7 %
Neutro Abs: 5.1 10*3/uL (ref 1.7–7.7)
Neutrophils Relative %: 59 %
Platelets: 400 10*3/uL (ref 150–400)
RBC: 4.84 MIL/uL (ref 3.87–5.11)
RDW: 13.6 % (ref 11.5–15.5)
WBC: 8.7 10*3/uL (ref 4.0–10.5)
nRBC: 0 % (ref 0.0–0.2)

## 2023-01-03 LAB — COMPREHENSIVE METABOLIC PANEL
ALT: 19 U/L (ref 0–44)
AST: 20 U/L (ref 15–41)
Albumin: 3.9 g/dL (ref 3.5–5.0)
Alkaline Phosphatase: 73 U/L (ref 38–126)
Anion gap: 10 (ref 5–15)
BUN: 15 mg/dL (ref 6–20)
CO2: 20 mmol/L — ABNORMAL LOW (ref 22–32)
Calcium: 8.8 mg/dL — ABNORMAL LOW (ref 8.9–10.3)
Chloride: 104 mmol/L (ref 98–111)
Creatinine, Ser: 0.88 mg/dL (ref 0.44–1.00)
GFR, Estimated: >60 mL/min (ref >60)
Glucose, Bld: 143 mg/dL — ABNORMAL HIGH (ref 70–99)
Potassium: 3.3 mmol/L — ABNORMAL LOW (ref 3.5–5.1)
Sodium: 134 mmol/L — ABNORMAL LOW (ref 135–145)
Total Bilirubin: 0.3 mg/dL (ref 0.3–1.2)
Total Protein: 7.8 g/dL (ref 6.5–8.1)

## 2023-01-03 LAB — HCG, SERUM, QUALITATIVE: Preg, Serum: NEGATIVE

## 2023-01-03 MED ORDER — MORPHINE SULFATE (PF) 4 MG/ML IV SOLN
4.0000 mg | Freq: Once | INTRAVENOUS | Status: AC
  Administered 2023-01-03: 4 mg via INTRAVENOUS
  Filled 2023-01-03: qty 1

## 2023-01-03 MED ORDER — ONDANSETRON HCL 4 MG/2ML IJ SOLN
4.0000 mg | Freq: Once | INTRAMUSCULAR | Status: AC
  Administered 2023-01-03: 4 mg via INTRAVENOUS
  Filled 2023-01-03: qty 2

## 2023-01-03 MED ORDER — SODIUM CHLORIDE 0.9 % IV BOLUS
1000.0000 mL | Freq: Once | INTRAVENOUS | Status: AC
  Administered 2023-01-03: 1000 mL via INTRAVENOUS

## 2023-01-03 MED ORDER — KETOROLAC TROMETHAMINE 30 MG/ML IJ SOLN
30.0000 mg | Freq: Once | INTRAMUSCULAR | Status: AC
  Administered 2023-01-03: 30 mg via INTRAVENOUS
  Filled 2023-01-03: qty 1

## 2023-01-03 MED ORDER — HYDROMORPHONE HCL 1 MG/ML IJ SOLN
1.0000 mg | Freq: Once | INTRAMUSCULAR | Status: AC
  Administered 2023-01-04: 1 mg via INTRAVENOUS
  Filled 2023-01-03: qty 1

## 2023-01-03 NOTE — ED Triage Notes (Signed)
Patient states she thinks she has a kidney stone. Patient has a history of kidney stones. Patient has right flank pain, and is actively vomiting. Pain 10/10

## 2023-01-04 MED ORDER — ONDANSETRON HCL 4 MG PO TABS: 4.0000 mg | ORAL_TABLET | Freq: Four times a day (QID) | ORAL | 0 refills | Status: DC

## 2023-01-04 MED ORDER — OXYCODONE HCL 5 MG PO TABS
5.0000 mg | ORAL_TABLET | ORAL | 0 refills | Status: DC | PRN
Start: 2023-01-04 — End: 2023-02-19

## 2023-01-04 MED ORDER — TAMSULOSIN HCL 0.4 MG PO CAPS: 0.4000 mg | ORAL_CAPSULE | Freq: Every day | ORAL | 0 refills | Status: DC

## 2023-01-04 MED ORDER — OXYCODONE HCL 5 MG PO TABS
5.0000 mg | ORAL_TABLET | ORAL | 0 refills | Status: DC | PRN
Start: 2023-01-04 — End: 2023-01-04

## 2023-01-04 MED ORDER — IBUPROFEN 600 MG PO TABS: 600.0000 mg | ORAL_TABLET | Freq: Four times a day (QID) | ORAL | 0 refills | Status: DC

## 2023-01-04 MED ORDER — HYDROMORPHONE HCL 1 MG/ML IJ SOLN
1.0000 mg | Freq: Once | INTRAMUSCULAR | Status: AC
  Administered 2023-01-04: 1 mg via INTRAVENOUS
  Filled 2023-01-04: qty 1

## 2023-01-04 NOTE — Discharge Instructions (Signed)
You were diagnosed today with a kidney stone.  I have prescribed ibuprofen to be taken every 6 hours, or oxycodone for breakthrough pain, Zofran to be taken as needed for nausea, and Flomax to be taken daily.  I have also provided contact information for urology for follow-up.  Return to the ED immediately if you develop fever, uncontrolled pain or vomiting, or other concerns.

## 2023-01-04 NOTE — ED Provider Notes (Signed)
[place] EMERGENCY DEPARTMENT AT [facility] Provider Note   CSN: [id number] Arrival date & time: 01/03/23  2056     History  Chief Complaint  Patient presents with   Flank Pain    Elizabeth [name] is a 37 y.o. female.  Patient presents to the emergency room complaining of right-sided flank pain, nausea, and vomiting which began earlier this morning.  She also complains of dysuria.  The patient states she is a history of multiple kidney stones including some that have required surgical intervention.  Her most recent has been within the past year.  Pain is rated 10 out of 10 in severity upon arrival.  She denies chest pain, shortness of breath, fevers.  Past medical history otherwise significant for chronic kidney disease, asthma, anemia   Flank Pain       Home Medications Prior to Admission medications   Medication Sig Start Date End Date Taking? Authorizing Provider  albuterol (VENTOLIN HFA) 108 (90 Base) MCG/ACT inhaler Inhale 2 puffs into the lungs every 6 (six) hours as needed for wheezing or shortness of breath. 12/16/19   Arthor Captain, PA-C  cetirizine (ZYRTEC) 10 MG tablet Take 10 mg by mouth daily.    [provider]  ibuprofen (ADVIL) 600 MG tablet Take 1 tablet (600 mg total) by mouth every 6 (six) hours. 01/04/23   Darrick Grinder, PA-C  ondansetron (ZOFRAN) 4 MG tablet Take 1 tablet (4 mg total) by mouth every 6 (six) hours. 01/04/23   Darrick Grinder, PA-C  oxyCODONE (ROXICODONE) 5 MG immediate release tablet Take 1 tablet (5 mg total) by mouth every 4 (four) hours as needed for severe pain. 01/04/23   Darrick Grinder, PA-C  Prenatal Vit-Fe Fumarate-FA (PRENATAL MULTIVITAMIN) TABS tablet Take 1 tablet by mouth daily at 12 noon.    [provider]  tamsulosin (FLOMAX) 0.4 MG CAPS capsule Take 1 capsule (0.4 mg total) by mouth daily. 01/04/23   Darrick Grinder, PA-C      Allergies    Patient has no known allergies.    Review of  Systems   Review of Systems  Genitourinary:  Positive for flank pain.    Physical Exam Updated Vital Signs BP (!) 122/93   Pulse 71   Temp 97.7 F (36.5 C) (Oral)   Resp 14   Ht 5' (1.524 m)   Wt 65.8 kg   SpO2 100%   BMI 28.32 kg/m  Physical Exam Vitals and nursing note reviewed.  Constitutional:      Appearance: She is well-developed.  HENT:     Head: Normocephalic and atraumatic.  Eyes:     Conjunctiva/sclera: Conjunctivae normal.  Cardiovascular:     Rate and Rhythm: Normal rate and regular rhythm.  Pulmonary:     Effort: Pulmonary effort is normal. No respiratory distress.     Breath sounds: Normal breath sounds.  Abdominal:     Palpations: Abdomen is soft.     Tenderness: There is no abdominal tenderness. There is right CVA tenderness.  Musculoskeletal:        General: No swelling.     Cervical back: Neck supple.  Skin:    General: Skin is warm and dry.     Capillary Refill: Capillary refill takes less than 2 seconds.  Neurological:     Mental Status: She is alert.  Psychiatric:        Mood and Affect: Mood normal.     ED Results / Procedures /  Treatments   Labs (all labs ordered are listed, but only abnormal results are displayed) Labs Reviewed  URINALYSIS, ROUTINE W REFLEX MICROSCOPIC - Abnormal; Notable for the following components:      Result Value   APPearance CLOUDY (*)    Hgb urine dipstick LARGE (*)    Leukocytes,Ua LARGE (*)    All other components within normal limits  COMPREHENSIVE METABOLIC PANEL - Abnormal; Notable for the following components:   Sodium 134 (*)    Potassium 3.3 (*)    CO2 20 (*)    Glucose, Bld 143 (*)    Calcium 8.8 (*)    All other components within normal limits  HCG, SERUM, QUALITATIVE  CBC WITH DIFFERENTIAL/PLATELET  CBC    EKG None  Radiology CT Renal Stone Study  Result Date: 01/03/2023 CLINICAL DATA:  Flank pain. EXAM: CT ABDOMEN AND PELVIS WITHOUT CONTRAST TECHNIQUE: Multidetector CT imaging of the  abdomen and pelvis was performed following the standard protocol without IV contrast. RADIATION DOSE REDUCTION: This exam was performed according to the departmental dose-optimization program which includes automated exposure control, adjustment of the mA and/or kV according to patient size and/or use of iterative reconstruction technique. COMPARISON:  January 29, 2021 FINDINGS: Lower chest: A stable, likely benign 3 mm noncalcified lung nodule is seen within the left lung base. Hepatobiliary: No focal liver abnormality is seen. No gallstones, gallbladder wall thickening, or biliary dilatation. Pancreas: Unremarkable. No pancreatic ductal dilatation or surrounding inflammatory changes. Spleen: Normal in size without focal abnormality. Adrenals/Urinary Tract: Adrenal glands are unremarkable. Kidneys are normal in size, without focal lesions. 1 mm and 3 mm nonobstructing renal calculi are seen within the left kidney. A 5 mm obstructing renal calculus is seen within the proximal right ureter, with moderate severity right-sided hydronephrosis, hydroureter and periureteral inflammatory fat stranding. Bladder is unremarkable. Stomach/Bowel: Stomach is within normal limits. Appendix appears normal. No evidence of bowel wall thickening, distention, or inflammatory changes. Vascular/Lymphatic: No significant vascular findings are present. No enlarged abdominal or pelvic lymph nodes. Reproductive: Uterus and bilateral adnexa are unremarkable. Other: No abdominal wall hernia or abnormality. No abdominopelvic ascites. Musculoskeletal: No acute or significant osseous findings. IMPRESSION: 1. 5 mm obstructing renal calculus within the proximal right ureter. 2. 1 mm and 3 mm nonobstructing left renal calculi. Electronically Signed   By: Aram Candela M.D.   On: 01/03/2023 23:58    Procedures Procedures    Medications Ordered in ED Medications  ketorolac (TORADOL) 30 MG/ML injection 30 mg (30 mg Intravenous Given  01/03/23 2222)  morphine (PF) 4 MG/ML injection 4 mg (4 mg Intravenous Given 01/03/23 2222)  sodium chloride 0.9 % bolus 1,000 mL (0 mLs Intravenous Stopped 01/04/23 0146)  ondansetron (ZOFRAN) injection 4 mg (4 mg Intravenous Given 01/03/23 2222)  HYDROmorphone (DILAUDID) injection 1 mg (1 mg Intravenous Given 01/04/23 0001)  HYDROmorphone (DILAUDID) injection 1 mg (1 mg Intravenous Given 01/04/23 0144)    ED Course/ Medical Decision Making/ A&P                                 Medical Decision Making Amount and/or Complexity of Data Reviewed Labs: ordered. Radiology: ordered.  Risk Prescription drug management.   This patient presents to the ED for concern of flank pain, this involves an extensive number of treatment options, and is a complaint that carries with it a high risk of complications and morbidity.  The differential diagnosis  includes nephrolithiasis, hydronephrosis, pyelonephritis, musculoskeletal pain, others   Co morbidities that complicate the patient evaluation  History of kidney stones, CKD   Additional history obtained:  Additional history obtained from family at bedside   Lab Tests:  I Ordered, and personally interpreted labs.  The pertinent results include: Potassium 3.3, no leukocytosis, UA with hemoglobin on the dipstick, leukocytes; negative pregnancy test   Imaging Studies ordered:  I ordered imaging studies including CT renal stone study I independently visualized and interpreted imaging which showed  1. 5 mm obstructing renal calculus within the proximal right ureter.  2. 1 mm and 3 mm nonobstructing left renal calculi.   I agree with the radiologist interpretation   Problem List / ED Course / Critical interventions / Medication management   I ordered medication including Toradol for inflammation, morphine and Dilaudid for pain, Zofran for nausea, normal saline for fluid resuscitation Reevaluation of the patient after these medicines showed  that the patient improved I have reviewed the patients home medicines and have made adjustments as needed   Social Determinants of Health:  Patient has Medicaid for primary health insurance type   Test / Admission - Considered:  Patient with 5 mm stone on the right side causing moderate hydronephrosis.  Patient was able to be discharged home with adequate pain control and after passing a p.o. challenge.  Plan to have patient follow-up with urology.  Prescription sent for Flomax, ibuprofen, Roxicodone, and Zofran.  Return precautions provided including inability to urinate, intractable pain, intractable nausea/vomiting.         Final Clinical Impression(s) / ED Diagnoses Final diagnoses:  Nephrolithiasis  Hydronephrosis with urinary obstruction due to ureteral calculus    Rx / DC Orders ED Discharge Orders          Ordered    ondansetron (ZOFRAN) 4 MG tablet  Every 6 hours,   Status:  Discontinued        01/04/23 0109    tamsulosin (FLOMAX) 0.4 MG CAPS capsule  Daily,   Status:  Discontinued        01/04/23 0109    oxyCODONE (ROXICODONE) 5 MG immediate release tablet  Every 4 hours PRN,   Status:  Discontinued        01/04/23 0109    ibuprofen (ADVIL) 600 MG tablet  Every 6 hours,   Status:  Discontinued        01/04/23 0109    ibuprofen (ADVIL) 600 MG tablet  Every 6 hours        01/04/23 0140    ondansetron (ZOFRAN) 4 MG tablet  Every 6 hours        01/04/23 0140    oxyCODONE (ROXICODONE) 5 MG immediate release tablet  Every 4 hours PRN        01/04/23 0140    tamsulosin (FLOMAX) 0.4 MG CAPS capsule  Daily        01/04/23 0140              Darrick Grinder, PA-C 01/04/23 4401    Tilden Fossa, MD 01/04/23 [id number]

## 2023-01-16 ENCOUNTER — Ambulatory Visit (HOSPITAL_BASED_OUTPATIENT_CLINIC_OR_DEPARTMENT_OTHER)
Admission: RE | Admit: 2023-01-16 | Discharge: 2023-01-16 | Disposition: A | Discharge status: Home / Self Care | Payer: Medicaid Other | Source: Ambulatory Visit | Attending: Urology | Admitting: Urology

## 2023-01-16 ENCOUNTER — Ambulatory Visit (INDEPENDENT_AMBULATORY_CARE_PROVIDER_SITE_OTHER): Payer: Medicaid Other | Admitting: Urology

## 2023-01-16 ENCOUNTER — Encounter: Payer: Self-pay | Admitting: Urology

## 2023-01-16 VITALS — BP 104/72 | HR 102 | Ht 60.0 in | Wt 140.0 lb

## 2023-01-16 DIAGNOSIS — N201 Calculus of ureter: Secondary | ICD-10-CM | POA: Insufficient documentation

## 2023-01-16 DIAGNOSIS — N200 Calculus of kidney: Secondary | ICD-10-CM | POA: Insufficient documentation

## 2023-01-16 DIAGNOSIS — N390 Urinary tract infection, site not specified: Secondary | ICD-10-CM | POA: Diagnosis not present

## 2023-01-16 LAB — URINALYSIS, ROUTINE W REFLEX MICROSCOPIC
Bilirubin, UA: NEGATIVE
Glucose, UA: NEGATIVE
Ketones, UA: NEGATIVE
Nitrite, UA: NEGATIVE
Protein,UA: NEGATIVE
Specific Gravity, UA: 1.015 (ref 1.005–1.030)
Urobilinogen, Ur: 0.2 mg/dL (ref 0.2–1.0)
pH, UA: 6.5 (ref 5.0–7.5)

## 2023-01-16 LAB — MICROSCOPIC EXAMINATION

## 2023-01-16 NOTE — Progress Notes (Signed)
Assessment: 1. Ureteral calculus, right   2. Urinary tract infection without hematuria, site unspecified   3. Nephrolithiasis     Plan: I personally reviewed the patient's chart including ER provider notes, labs and imaging results. I personally reviewed the CT study from 01/03/2023 and a KUB from 01/10/2023. I also reviewed records from [facility]. I personally reviewed the KUB from today.  This study does not show any obvious calcification along the expected course of the right ureter.  It is difficult to evaluate for the left renal calculi due to overlying gas. It appears that the stone that she passed was the right ureteral calculus. Stone sent for analysis. I recommended that she complete the antibiotics as prescribed for the UTI diagnosed last week. Return to office in 1 month.  Chief Complaint:  Chief Complaint  Patient presents with   Nephrolithiasis    History of Present Illness:  Elizabeth [name] is a 37 y.o. female who is seen for evaluation of a right ureteral calculus. She presented to the emergency room on 01/03/2023 with right flank pain.  CT imaging showed a 6 mm stone in the proximal right ureter with associated hydronephrosis.  She was treated with pain medication and discharged.  She followed up with Dr. Lafonda Mosses at [facility] on 01/10/2023.  KUB at that time showed some distal migration of the stone to the level of L4/5.  Apparently treatment options were discussed with the patient and she was primarily interested in ureteroscopy.  Due to her insurance, she was unable to have the procedure done through [facility].  She is referred here for continued management.  She reports passing a stone after her visit on 01/10/2023.  She did have some intermittent flank pain following stone passage.  She is not having any significant symptoms at the present time.  No fevers, chills, nausea, or vomiting.  No dysuria or gross hematuria.  Urine culture from 01/10/2023  grew >100 K E. coli.  She has not started any antibiotics.  Portions of the above documentation were copied from a prior visit for review purposes only.  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    Chronic kidney disease    left ureteral stone    HSV (herpes simplex virus) infection    Recurrent upper respiratory infection (URI)    01/09/11 URI - no problems now    Trichomonas     Past Surgical History:  Past Surgical History:  Procedure Laterality Date   CYSTOSCOPY/RETROGRADE/URETEROSCOPY  02/23/2011   Procedure: CYSTOSCOPY/RETROGRADE/URETEROSCOPY;  Surgeon: Crecencio Mc, MD;  Location: [facility];  Service: Urology;  Laterality: Left;  CYSTOSCOPY, LEFT RETROGRADE PYLEOGRADE//LEFT URETEROSCOPY LASER LITHOTRIPSY, LEFT URETERAL STENT  PLACEMENT   INDUCED ABORTION     NO PAST SURGERIES     no previous surgeries     Allergies:  No Known Allergies  Family History:  No family history on file.  Social History:  Social History   Tobacco Use   Smoking status: Former    Current packs/day: 0.00    Types: Cigarettes    Quit date: 04/26/2021    Years since quitting: 1.7   Smokeless tobacco: Never  Substance Use Topics   Alcohol use: No   Drug use: No    Review of symptoms:  Constitutional:  Negative for unexplained weight loss, night sweats, fever, chills ENT:  Negative for nose bleeds, sinus pain, painful swallowing CV:  Negative for chest pain, shortness of breath, exercise intolerance, palpitations, loss of consciousness Resp:  Negative for cough, wheezing, shortness of breath GI:  Negative for nausea, vomiting, diarrhea, bloody stools GU:  Positives noted in HPI; otherwise negative for gross hematuria, dysuria, urinary incontinence Neuro:  Negative for seizures, poor balance, limb weakness, slurred speech Psych:  Negative for lack of  energy, depression, anxiety Endocrine:  Negative for polydipsia, polyuria, symptoms of hypoglycemia (dizziness, hunger, sweating) Hematologic:  Negative for anemia, purpura, petechia, prolonged or excessive bleeding, use of anticoagulants  Allergic:  Negative for difficulty breathing or choking as a result of exposure to anything; no shellfish allergy; no allergic response (rash/itch) to materials, foods  Physical exam: BP 104/72   Pulse (!) 102   Ht 5' (1.524 m)   Wt 140 lb (63.5 kg)   BMI 27.34 kg/m  GENERAL APPEARANCE:  Well appearing, well developed, well nourished, NAD HEENT: Atraumatic, Normocephalic, oropharynx clear. NECK: Supple without lymphadenopathy or thyromegaly. LUNGS: Clear to auscultation bilaterally. HEART: Regular Rate and Rhythm without murmurs, gallops, or rubs. ABDOMEN: Soft, non-tender, No Masses. EXTREMITIES: Moves all extremities well.  Without clubbing, cyanosis, or edema. NEUROLOGIC:  Alert and oriented x 3, normal gait, CN II-XII grossly intact.  MENTAL STATUS:  Appropriate. BACK:  Non-tender to palpation.  No CVAT SKIN:  Warm, dry and intact.    Results: U/A:  6-10 WBC, 0-2 RBC, few bacteria

## 2023-01-24 LAB — CALCULI, WITH PHOTOGRAPH (CLINICAL LAB)
Calcium Oxalate Monohydrate: 10 %
Hydroxyapatite: 90 %
Weight Calculi: 67 mg

## 2023-01-30 ENCOUNTER — Encounter: Payer: Self-pay | Admitting: Urology

## 2023-02-19 ENCOUNTER — Ambulatory Visit (INDEPENDENT_AMBULATORY_CARE_PROVIDER_SITE_OTHER): Payer: Medicaid Other | Admitting: Urology

## 2023-02-19 ENCOUNTER — Encounter: Payer: Self-pay | Admitting: Urology

## 2023-02-19 VITALS — BP 112/71 | HR 74 | Ht 60.0 in | Wt 150.0 lb

## 2023-02-19 DIAGNOSIS — Z87440 Personal history of urinary (tract) infections: Secondary | ICD-10-CM | POA: Diagnosis not present

## 2023-02-19 DIAGNOSIS — Z09 Encounter for follow-up examination after completed treatment for conditions other than malignant neoplasm: Secondary | ICD-10-CM

## 2023-02-19 DIAGNOSIS — Z87442 Personal history of urinary calculi: Secondary | ICD-10-CM | POA: Diagnosis not present

## 2023-02-19 DIAGNOSIS — N201 Calculus of ureter: Secondary | ICD-10-CM

## 2023-02-19 DIAGNOSIS — N200 Calculus of kidney: Secondary | ICD-10-CM

## 2023-02-19 LAB — MICROSCOPIC EXAMINATION

## 2023-02-19 LAB — URINALYSIS, ROUTINE W REFLEX MICROSCOPIC
Bilirubin, UA: NEGATIVE
Glucose, UA: NEGATIVE
Ketones, UA: NEGATIVE
Nitrite, UA: NEGATIVE
Protein,UA: NEGATIVE
RBC, UA: NEGATIVE
Specific Gravity, UA: 1.02 (ref 1.005–1.030)
Urobilinogen, Ur: 0.2 mg/dL (ref 0.2–1.0)
pH, UA: 7 (ref 5.0–7.5)

## 2023-02-19 NOTE — Progress Notes (Signed)
Assessment: 1. Ureteral calculus, right   2. History of UTI   3. Nephrolithiasis     Plan: Stone prevention discussed and information provided. Return to office prn  Chief Complaint:  Chief Complaint  Patient presents with   Nephrolithiasis    History of Present Illness:  Elizabeth [name] is a 37 y.o. female who is seen for further evaluation of a right ureteral calculus,. nephrolithiasis, and history of UTI.  She presented to the emergency room on 01/03/2023 with right flank pain.  CT imaging showed a 6 mm stone in the proximal right ureter with associated hydronephrosis.  She was treated with pain medication and discharged.  She followed up with Dr. Lafonda Mosses at [facility] on 01/10/2023.  KUB at that time showed some distal migration of the stone to the level of L4/5.  Apparently treatment options were discussed with the patient and she was primarily interested in ureteroscopy.  Due to her insurance, she was unable to have the procedure done through [facility].  She was referred here for continued management.  She reported passing a stone after her visit on 01/10/2023.  She did have some intermittent flank pain following stone passage.  She was not having any significant symptoms at the time of her visit on 01/16/23.  No fevers, chills, nausea, or vomiting.  No dysuria or gross hematuria.  Urine culture from 01/10/2023 grew >100 K E. coli. She was treated with antibiotics. Stone analysis showed 10% calcium oxalate monohydrate and 90% hydroxyapatite.  She returns today for follow-up.  She is not having any current stone symptoms.  No dysuria or gross hematuria.  She completed her antibiotics for the UTI.   Portions of the above documentation were copied from a prior visit for review purposes only.  Past Medical History:  Past Medical History:  Diagnosis Date   Anemia    low iron on no meds   Asthma    Bacterial vaginosis    Bartholin cyst    Blood dyscrasia    Rh  negative    Chronic kidney disease    left ureteral stone    HSV (herpes simplex virus) infection    Recurrent upper respiratory infection (URI)    01/09/11 URI - no problems now    Trichomonas     Past Surgical History:  Past Surgical History:  Procedure Laterality Date   CYSTOSCOPY/RETROGRADE/URETEROSCOPY  02/23/2011   Procedure: CYSTOSCOPY/RETROGRADE/URETEROSCOPY;  Surgeon: Crecencio Mc, MD;  Location: [facility];  Service: Urology;  Laterality: Left;  CYSTOSCOPY, LEFT RETROGRADE PYLEOGRADE//LEFT URETEROSCOPY LASER LITHOTRIPSY, LEFT URETERAL STENT  PLACEMENT   INDUCED ABORTION     NO PAST SURGERIES     no previous surgeries     Allergies:  No Known Allergies  Family History:  No family history on file.  Social History:  Social History   Tobacco Use   Smoking status: Former    Current packs/day: 0.00    Types: Cigarettes    Quit date: 04/26/2021    Years since quitting: 1.8   Smokeless tobacco: Never  Substance Use Topics   Alcohol use: No   Drug use: No    ROS: Constitutional:  Negative for fever, chills, weight loss CV: Negative for chest pain, previous MI, hypertension Respiratory:  Negative for shortness of breath, wheezing, sleep apnea, frequent cough GI:  Negative for nausea, vomiting, bloody stool, GERD  Physical exam: BP 112/71   Pulse 74   Ht 5' (1.524 m)   Wt 150 lb (68  kg)   BMI 29.29 kg/m  GENERAL APPEARANCE:  Well appearing, well developed, well nourished, NAD HEENT:  Atraumatic, normocephalic, oropharynx clear NECK:  Supple without lymphadenopathy or thyromegaly ABDOMEN:  Soft, non-tender, no masses EXTREMITIES:  Moves all extremities well, without clubbing, cyanosis, or edema NEUROLOGIC:  Alert and oriented x 3, normal gait, CN II-XII grossly intact MENTAL STATUS:  appropriate BACK:  Non-tender to palpation, No CVAT SKIN:  Warm, dry, and intact  Results: U/A:  0-5 WBC, 0 RBC

## 2023-03-25 IMAGING — CT CT ABD-PELV W/O CM
1 of 2 series · 14 of 32 positions shown, 19 images · non-contrast
Comparison: 02/20/2011

CLINICAL DATA: Right flank pain

EXAM:
CT ABDOMEN AND PELVIS WITHOUT CONTRAST
TECHNIQUE: Multidetector CT imaging of the abdomen and pelvis was performed
following the standard protocol without IV contrast.

[Series 2: renal standard/full · axial · 0.62mm/px · z∈[-450,-95]mm · 14 of 81 slices shown, 19 images]
[im 5/81  soft-tissue]
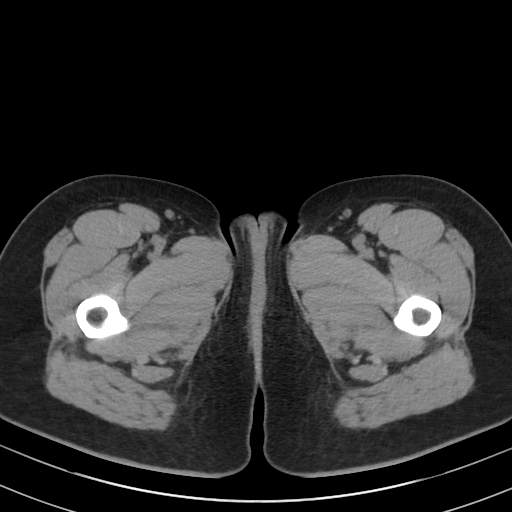
[im 5/81  bone]
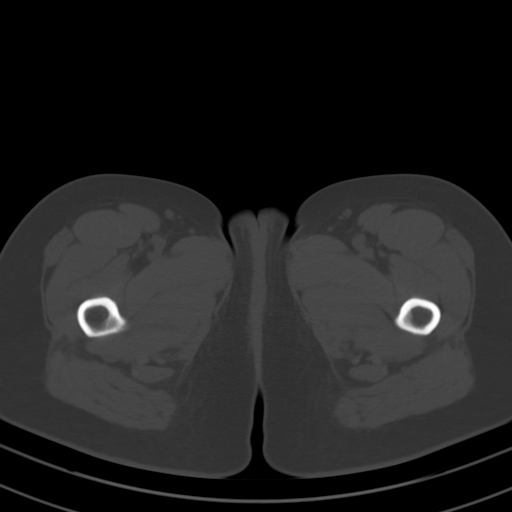
[im 9/81  soft-tissue]
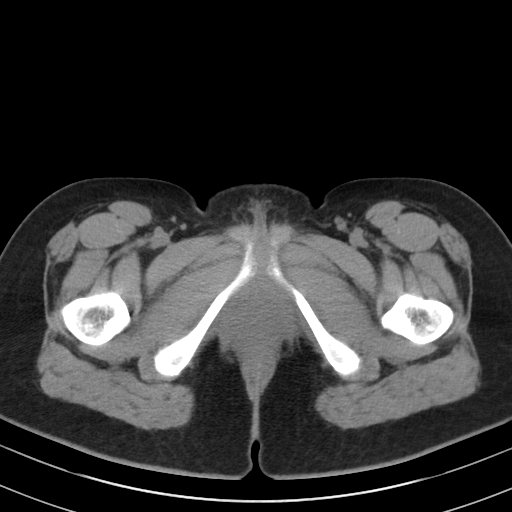
[im 18/81  soft-tissue]
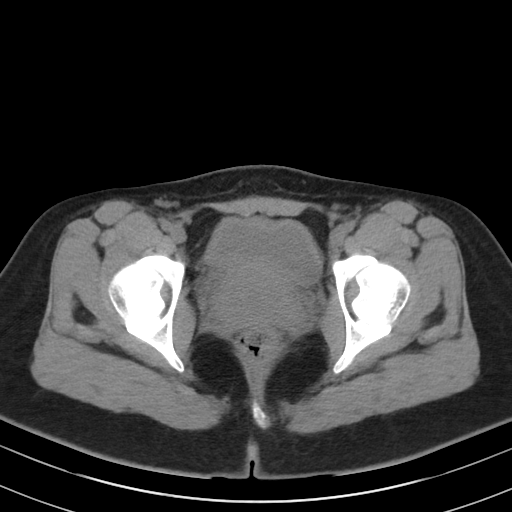
[im 23/81  soft-tissue]
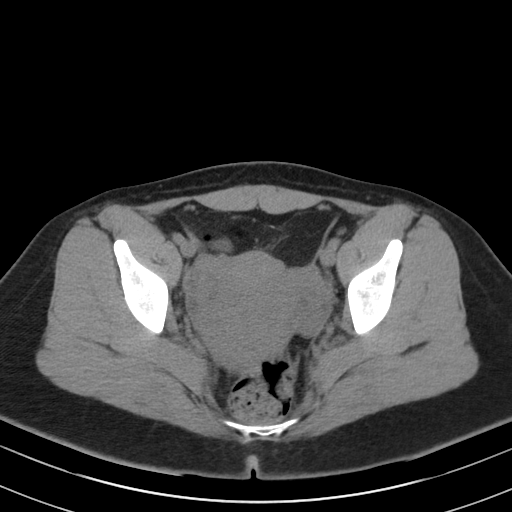
[im 27/81  soft-tissue]
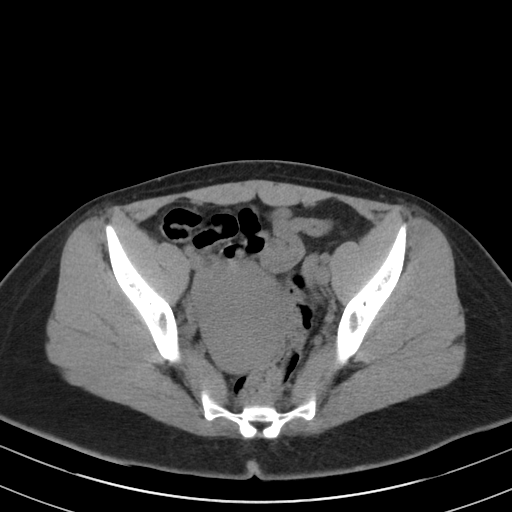
[im 36/81  soft-tissue]
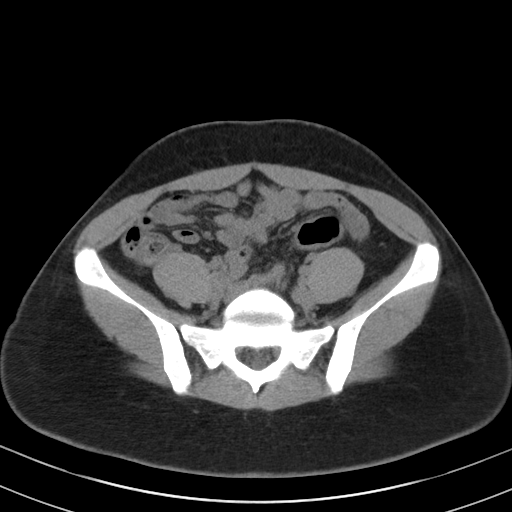
[im 41/81  soft-tissue]
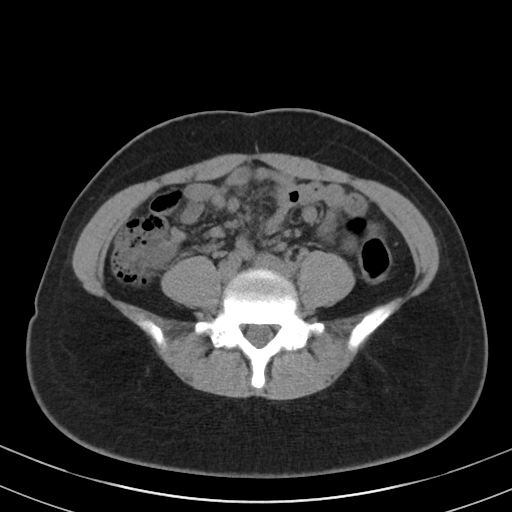
[im 45/81  soft-tissue]
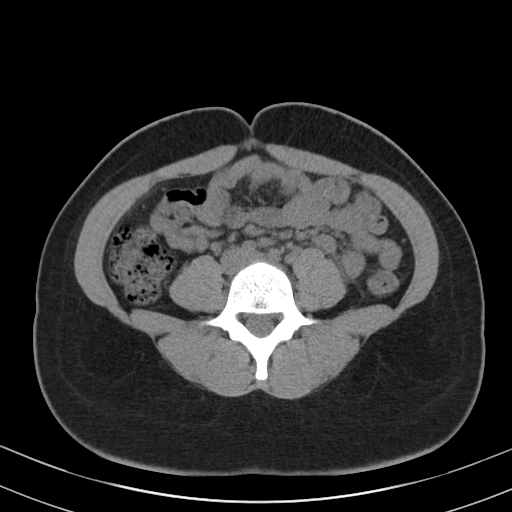
[im 54/81  soft-tissue]
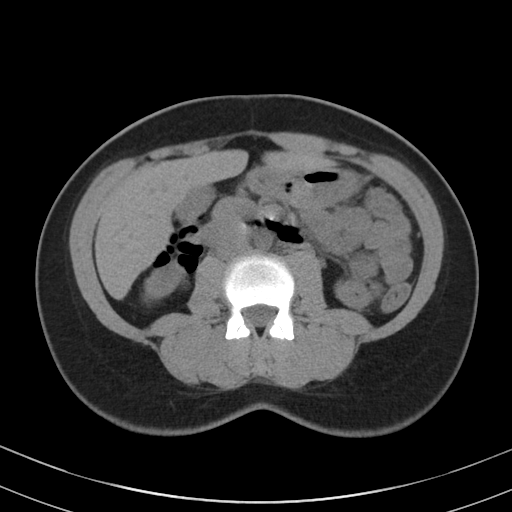
[im 54/81  bone]
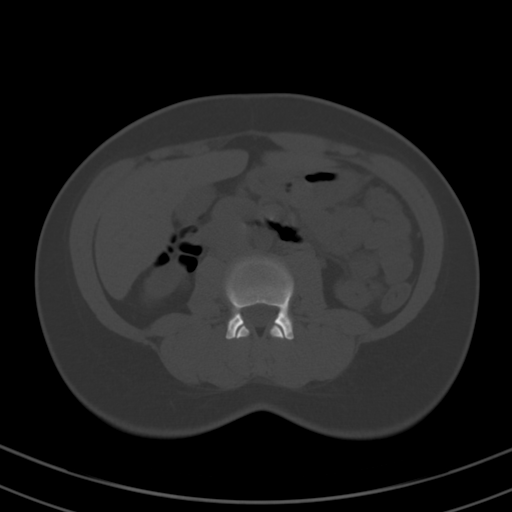
[im 58/81  soft-tissue]
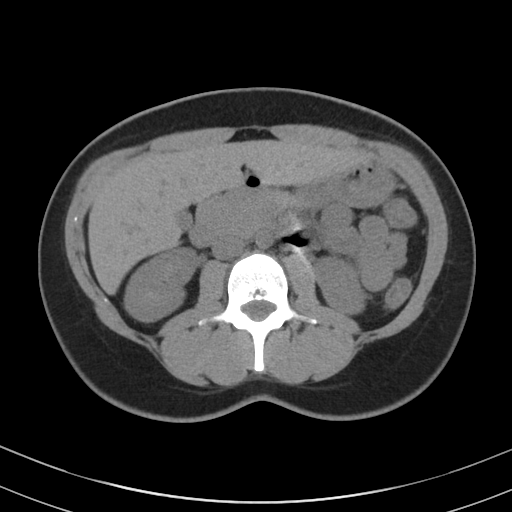
[im 63/81  soft-tissue]
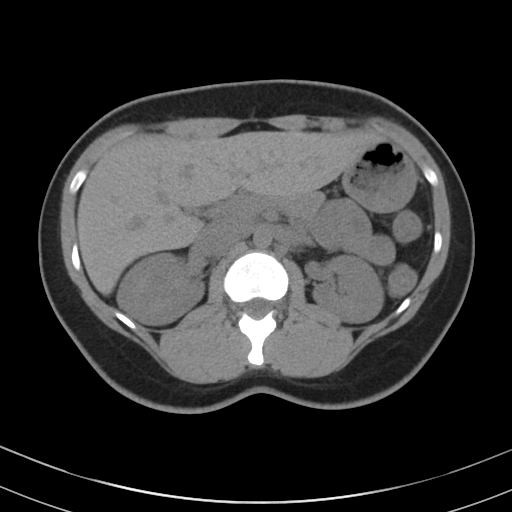
[im 63/81  lung]
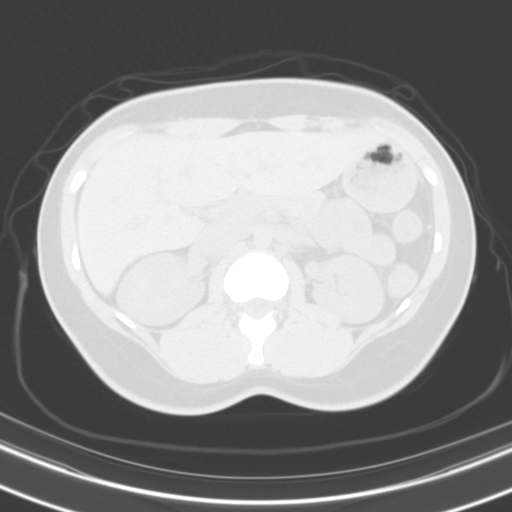
[im 67/81  lung]
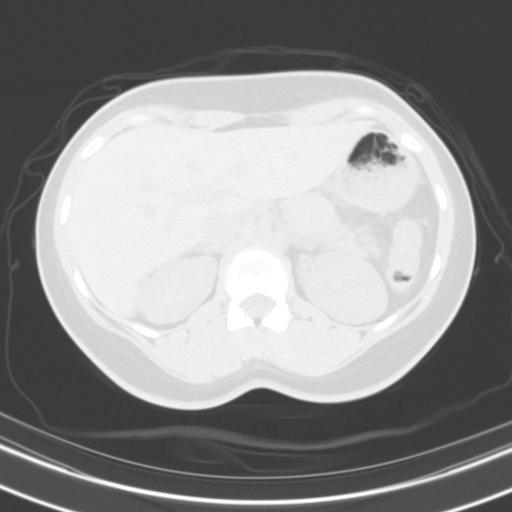
[im 72/81  soft-tissue]
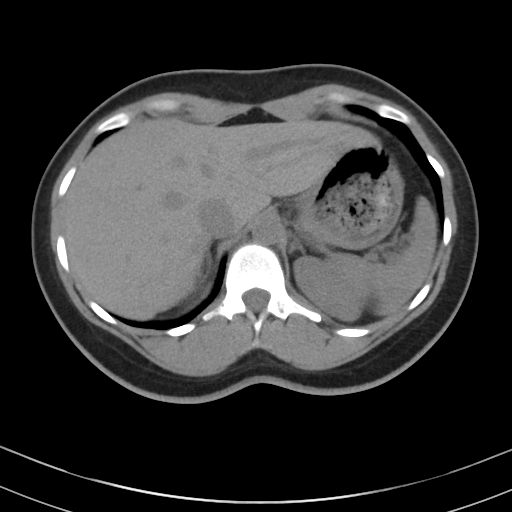
[im 72/81  lung]
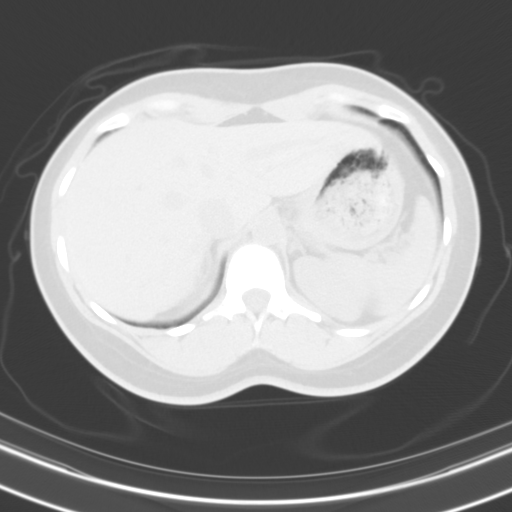
[im 76/81  soft-tissue]
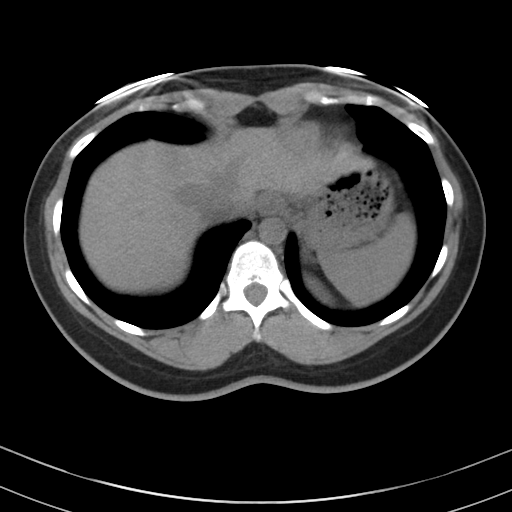
[im 76/81  lung]
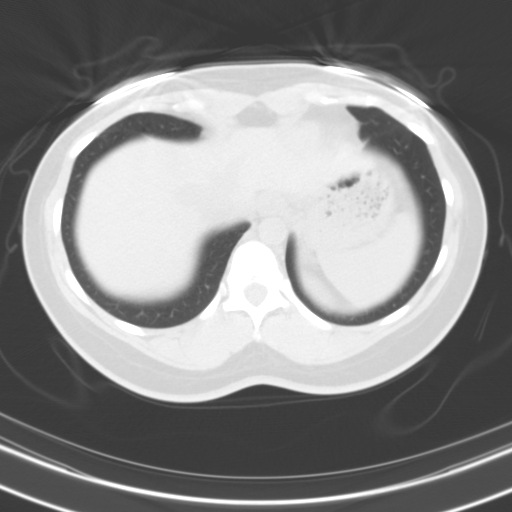

[14 of 32 positions shown; findings below may reference images not displayed]

FINDINGS: Lower chest: No acute abnormality.

Hepatobiliary: No focal liver abnormality is seen. No gallstones,
gallbladder wall thickening, or biliary dilatation.

Pancreas: Unremarkable. No pancreatic ductal dilatation or
surrounding inflammatory changes.

Spleen: Normal in size without focal abnormality.

Adrenals/Urinary Tract: Adrenal glands unremarkable. 4 mm calculus
in the lower pole left renal collecting system. 1 mm calculus, right
lower pole collecting system. No hydronephrosis. Urinary bladder
incompletely distended.

Stomach/Bowel: Stomach partially distended by ingested material.
Small bowel decompressed. Normal appendix. The colon is nondilated,
unremarkable.

Vascular/Lymphatic: No significant vascular findings are present. No
enlarged abdominal or pelvic lymph nodes.

Reproductive: Enlarged globular uterus as before. Bilateral adnexa
are unremarkable.

Other: No ascites.  No free air.

Musculoskeletal: Umbilical hardware. No fracture or worrisome bone
lesion.
IMPRESSION: 1. No acute findings.
2. Bilateral nonobstructive urolithiasis.

## 2023-04-18 ENCOUNTER — Emergency Department (HOSPITAL_COMMUNITY)
Admission: EM | Admit: 2023-04-18 | Discharge: 2023-04-18 | Disposition: A | Discharge status: Home / Self Care | Payer: Medicaid Other | Attending: Emergency Medicine | Admitting: Emergency Medicine

## 2023-04-18 ENCOUNTER — Emergency Department (HOSPITAL_COMMUNITY): Payer: Medicaid Other

## 2023-04-18 ENCOUNTER — Encounter (HOSPITAL_COMMUNITY): Payer: Self-pay | Admitting: Emergency Medicine

## 2023-04-18 DIAGNOSIS — E876 Hypokalemia: Secondary | ICD-10-CM | POA: Insufficient documentation

## 2023-04-18 DIAGNOSIS — R000 Tachycardia, unspecified: Secondary | ICD-10-CM | POA: Insufficient documentation

## 2023-04-18 DIAGNOSIS — Z7951 Long term (current) use of inhaled steroids: Secondary | ICD-10-CM | POA: Diagnosis not present

## 2023-04-18 DIAGNOSIS — J101 Influenza due to other identified influenza virus with other respiratory manifestations: Secondary | ICD-10-CM | POA: Diagnosis not present

## 2023-04-18 DIAGNOSIS — J4541 Moderate persistent asthma with (acute) exacerbation: Secondary | ICD-10-CM | POA: Diagnosis not present

## 2023-04-18 DIAGNOSIS — Z20822 Contact with and (suspected) exposure to covid-19: Secondary | ICD-10-CM | POA: Diagnosis not present

## 2023-04-18 DIAGNOSIS — R0602 Shortness of breath: Secondary | ICD-10-CM | POA: Diagnosis present

## 2023-04-18 LAB — BASIC METABOLIC PANEL
Anion gap: 12 (ref 5–15)
BUN: 12 mg/dL (ref 6–20)
CO2: 22 mmol/L (ref 22–32)
Calcium: 9.1 mg/dL (ref 8.9–10.3)
Chloride: 105 mmol/L (ref 98–111)
Creatinine, Ser: 1.11 mg/dL — ABNORMAL HIGH (ref 0.44–1.00)
GFR, Estimated: >60 mL/min (ref >60)
Glucose, Bld: 125 mg/dL — ABNORMAL HIGH (ref 70–99)
Potassium: 3.1 mmol/L — ABNORMAL LOW (ref 3.5–5.1)
Sodium: 139 mmol/L (ref 135–145)

## 2023-04-18 LAB — RESP PANEL BY RT-PCR (RSV, FLU A&B, COVID)  RVPGX2
Influenza A by PCR: POSITIVE — AB
Influenza B by PCR: NEGATIVE
Resp Syncytial Virus by PCR: NEGATIVE
SARS Coronavirus 2 by RT PCR: NEGATIVE

## 2023-04-18 MED ORDER — PREDNISONE 20 MG PO TABS: 40.0000 mg | ORAL_TABLET | Freq: Every day | ORAL | 0 refills | Status: AC

## 2023-04-18 MED ORDER — PREDNISONE 20 MG PO TABS
60.0000 mg | ORAL_TABLET | Freq: Once | ORAL | Status: AC
  Administered 2023-04-18: 60 mg via ORAL
  Filled 2023-04-18: qty 3

## 2023-04-18 MED ORDER — OSELTAMIVIR PHOSPHATE 75 MG PO CAPS: 75.0000 mg | ORAL_CAPSULE | Freq: Two times a day (BID) | ORAL | 0 refills | Status: AC

## 2023-04-18 MED ORDER — ACETAMINOPHEN 325 MG PO TABS
975.0000 mg | ORAL_TABLET | Freq: Once | ORAL | Status: AC
  Administered 2023-04-18: 975 mg via ORAL
  Filled 2023-04-18: qty 3

## 2023-04-18 MED ORDER — ALBUTEROL SULFATE HFA 108 (90 BASE) MCG/ACT IN AERS
2.0000 | INHALATION_SPRAY | RESPIRATORY_TRACT | Status: DC | PRN
  Administered 2023-04-18: 2 via RESPIRATORY_TRACT
  Filled 2023-04-18: qty 6.7

## 2023-04-18 NOTE — ED Triage Notes (Signed)
 Pt arrives via [facility] from [facility] where she was having SOB. Pt given 3 breathing treatments en route. Flu like symptoms started yesterday. Recent sick contact. Hx of asthma.

## 2023-04-18 NOTE — Discharge Instructions (Signed)
 You have the flu today and you are going to feel unwell for the next 7 to 10 days.  Take Tylenol  or ibuprofen  every 6 hours as needed for fever.  Drink fluids and rest.  Use your rescue inhaler as needed and take the prednisone  for the next 5 days to prevent your asthma from flaring.  If you feel like symptoms are getting worse your breathing is getting worse despite the medications return to the emergency room for recheck.

## 2023-04-18 NOTE — ED Provider Notes (Signed)
 [place] [facility] AT [facility] Provider Note   CSN: [id number] Arrival date & time: 04/18/23  1250     History  Chief Complaint  Patient presents with   Shortness of Breath    Elizabeth [name] is a 38 y.o. female.  Patient is a 38 year old female with a history of asthma, anemia who is presenting today from urgent care due to persistent shortness of breath and coughing.  Patient reports for the last 24 hours she has had myalgias, malaise, cough, nasal congestion and then started experiencing shortness of breath last night that continued to get worse.  She reports she did not have an inhaler at home to use and by the time she got to urgent care they reported she was in respiratory distress.  She received 3 nebs back-to-back and route to the emergency room.  That was approximately 5 hours ago and she reports she is still feeling a little short of breath but much better than what she did earlier.  She has had no nausea and vomiting.  Denies any abdominal pain or chest pain at this time.  The history is provided by the patient.  Shortness of Breath      Home Medications Prior to Admission medications   Medication Sig Start Date End Date Taking? Authorizing Provider  oseltamivir  (TAMIFLU ) 75 MG capsule Take 1 capsule (75 mg total) by mouth every 12 (twelve) hours. 04/18/23  Yes Doretha Folks, MD  predniSONE  (DELTASONE ) 20 MG tablet Take 2 tablets (40 mg total) by mouth daily. 04/18/23  Yes Doretha Folks, MD  albuterol  (VENTOLIN  HFA) 108 (90 Base) MCG/ACT inhaler Inhale 2 puffs into the lungs every 6 (six) hours as needed for wheezing or shortness of breath. 12/16/19   Harris, Abigail, PA-C  cetirizine (ZYRTEC) 10 MG tablet Take 10 mg by mouth daily.    [provider]      Allergies    Patient has no known allergies.    Review of Systems   Review of Systems  Respiratory:  Positive for shortness of breath.     Physical Exam Updated Vital  Signs BP 106/82 (BP Location: Right Arm)   Pulse (!) 112   Temp 98.9 F (37.2 C) (Oral)   Resp 18   Ht 5' (1.524 m)   Wt 68 kg   LMP 04/06/2023 (Approximate)   SpO2 96%   BMI 29.28 kg/m  Physical Exam Vitals and nursing note reviewed.  Constitutional:      General: She is not in acute distress.    Appearance: She is well-developed.  HENT:     Head: Normocephalic and atraumatic.     Nose: Congestion present.     Mouth/Throat:     Mouth: Mucous membranes are moist.  Eyes:     Pupils: Pupils are equal, round, and reactive to light.  Cardiovascular:     Rate and Rhythm: Normal rate and regular rhythm.     Heart sounds: Normal heart sounds. No murmur heard.    No friction rub.  Pulmonary:     Effort: Pulmonary effort is normal. No tachypnea.     Breath sounds: Normal breath sounds. No wheezing or rales.  Abdominal:     General: Bowel sounds are normal. There is no distension.     Palpations: Abdomen is soft.     Tenderness: There is no abdominal tenderness. There is no guarding or rebound.  Musculoskeletal:        General: No tenderness. Normal range  of motion.     Comments: No edema  Skin:    General: Skin is warm and dry.     Findings: No rash.  Neurological:     Mental Status: She is alert and oriented to person, place, and time.     Cranial Nerves: No cranial nerve deficit.  Psychiatric:        Behavior: Behavior normal.     ED Results / Procedures / Treatments   Labs (all labs ordered are listed, but only abnormal results are displayed) Labs Reviewed  RESP PANEL BY RT-PCR (RSV, FLU A&B, COVID)  RVPGX2 - Abnormal; Notable for the following components:      Result Value   Influenza A by PCR POSITIVE (*)    All other components within normal limits  BASIC METABOLIC PANEL - Abnormal; Notable for the following components:   Potassium 3.1 (*)    Glucose, Bld 125 (*)    Creatinine, Ser 1.11 (*)    All other components within normal limits    EKG EKG  Interpretation Date/Time:  Wednesday April 18 2023 13:05:48 EST Ventricular Rate:  135 PR Interval:  97 QRS Duration:  84 QT Interval:  355 QTC Calculation: 533 R Axis:   84  Text Interpretation: Sinus tachycardia Consider right atrial enlargement Probable anterior infarct, age indeterminate Abnormal T, consider ischemia, inferior leads new Prolonged QT interval Confirmed by Doretha Folks ([id number]) on 04/18/2023 5:31:31 PM  Radiology DG Chest 2 View Result Date: 04/18/2023 CLINICAL DATA:  Shortness of breath, fever, and cough. EXAM: CHEST - 2 VIEW COMPARISON:  Chest radiograph dated 12/16/2019. FINDINGS: The heart size and mediastinal contours are within normal limits. Both lungs are clear. The visualized skeletal structures are unremarkable. IMPRESSION: No active cardiopulmonary disease. Electronically Signed   By: Vanetta Chou M.D.   On: 04/18/2023 14:50    Procedures Procedures    Medications Ordered in ED Medications  predniSONE  (DELTASONE ) tablet 60 mg (has no administration in time range)  albuterol  (VENTOLIN  HFA) 108 (90 Base) MCG/ACT inhaler 2 puff (has no administration in time range)  acetaminophen  (TYLENOL ) tablet 975 mg (975 mg Oral Given 04/18/23 1328)    ED Course/ Medical Decision Making/ A&P                                 Medical Decision Making Amount and/or Complexity of Data Reviewed Labs: ordered. Decision-making details documented in ED Course. Radiology: ordered and independent interpretation performed. Decision-making details documented in ED Course. ECG/medicine tests: ordered and independent interpretation performed. Decision-making details documented in ED Course.  Risk Prescription drug management.   Pt with symptoms consistent with influenza.  Normal exam here but is febrile.  Initially was having SOB but now improved after nebs.  No signs of strep pharyngitis or abnormal abdominal findings.   I have independently visualized and interpreted  pt's images today. CXR wnl.  I independently interpreted patient's labs and EKG and patient is flu positive today, BMP with mild hypokalemia of 3.1 most likely from recent albuterol  use.  EKG shows sinus tachycardia with some nonspecific ST changes probably related to rate.   Patient currently looks much better.  She is satting 96% on room air.  Is not having any wheezing at this time.  Will continue antipyretica and rest and fluids and return for any further problems.  Patient given a new inhaler here, prescription for prednisone  due to asthma exacerbation from the  flu and Tamiflu  as her symptoms just started yesterday.  She was given return precautions.  She is comfortable with this plan         Final Clinical Impression(s) / ED Diagnoses Final diagnoses:  Moderate persistent asthma with exacerbation  Influenza A    Rx / DC Orders ED Discharge Orders          Ordered    oseltamivir  (TAMIFLU ) 75 MG capsule  Every 12 hours        04/18/23 1818    predniSONE  (DELTASONE ) 20 MG tablet  Daily        04/18/23 1818              Doretha Folks, MD 04/18/23 1831

## 2023-04-18 NOTE — ED Provider Triage Note (Addendum)
 Emergency Medicine Provider Triage Evaluation Note  Elizabeth [name] , a 38 y.o. female  was evaluated in triage.  Pt complains of sob, productive cough, rhinorrhea that started yesterday.  Sent by UC for sob. Had three nebulizers PTA with some relief. Brought in by EMS. Placed on O2 as precaution but did not note hypoxia enroute.  Sick contact sister with URI symptoms. Has since gotten better.  Review of Systems  Positive: Sob, cough, rhinorrhea, fever (100F) Negative: cp  Physical Exam  BP (!) 132/97 (BP Location: Left Arm)   Pulse (!) 141   Temp 99.6 F (37.6 C) (Oral)   Resp (!) 22   Ht 5' (1.524 m)   Wt 68 kg   SpO2 100%   BMI 29.28 kg/m  Gen:   Awake, no distress   Resp:  Normal effort. No wheezing MSK:   Moves extremities without difficulty  Other:    Medical Decision Making  Medically screening exam initiated at 1:17 PM.  Appropriate orders placed.  [facility] was informed that the remainder of the evaluation will be completed by another provider, this initial triage assessment does not replace that evaluation, and the importance of remaining in the ED until their evaluation is complete.  Labs, xr ordered   Minnie Tinnie BRAVO, PA 04/18/23 1320    Minnie Tinnie BRAVO, PA 04/18/23 1322

## 2023-08-04 IMAGING — US US RENAL
1 series · 14 of 25 positions shown · non-contrast
Comparison: January 29, 2021.

CLINICAL DATA: Renal calculus; flank pain

EXAM:
RENAL / URINARY TRACT ULTRASOUND COMPLETE

[Series 1: us renal · 0.23mm/px · 14 of 34 slices shown]
[im 1/34]
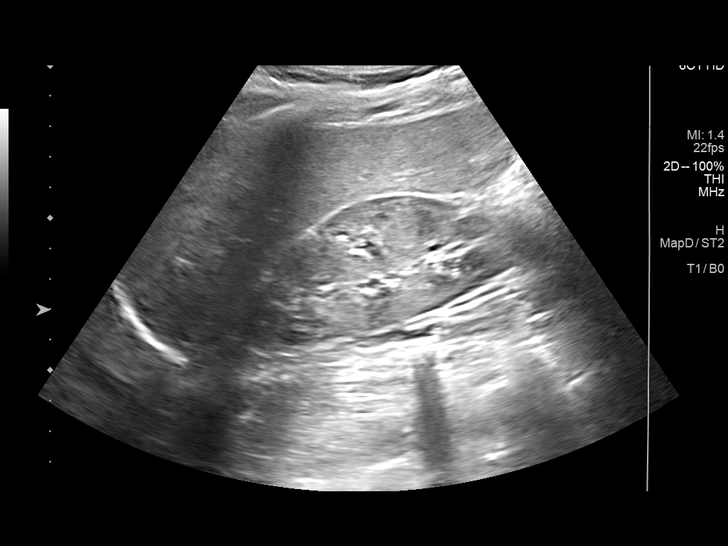
[im 3/34]
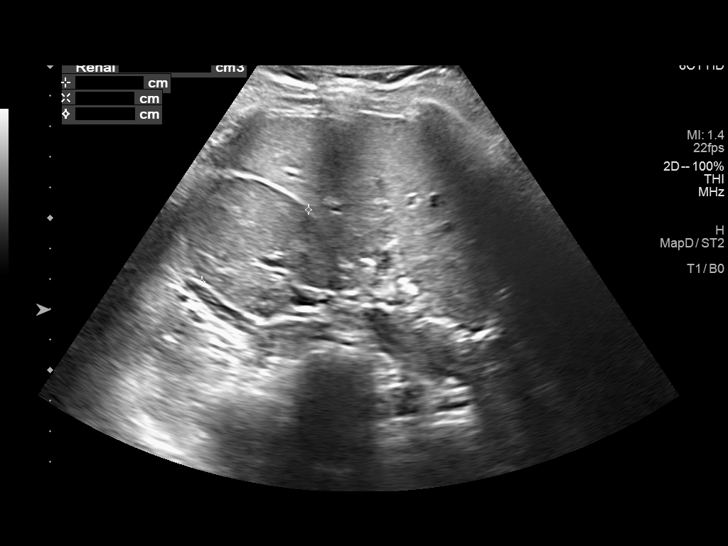
[im 6/34]
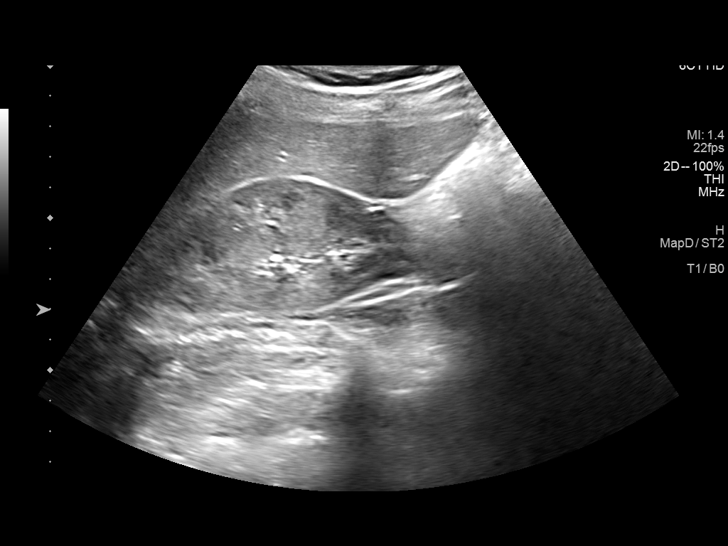
[im 9/34]
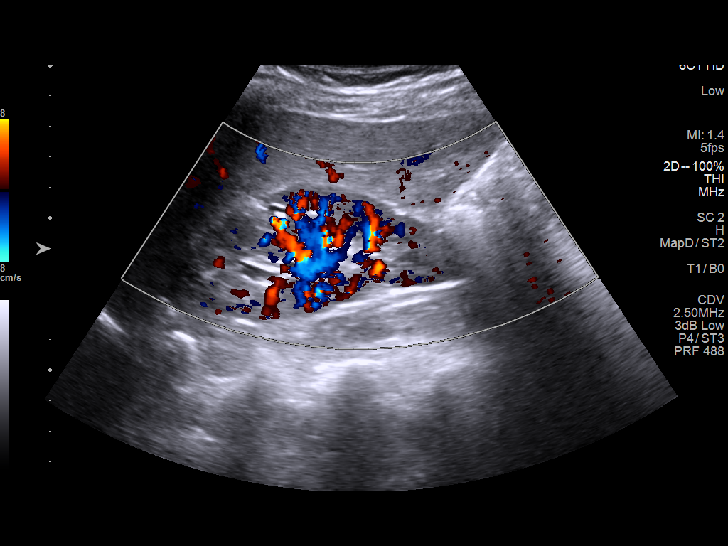
[im 12/34]
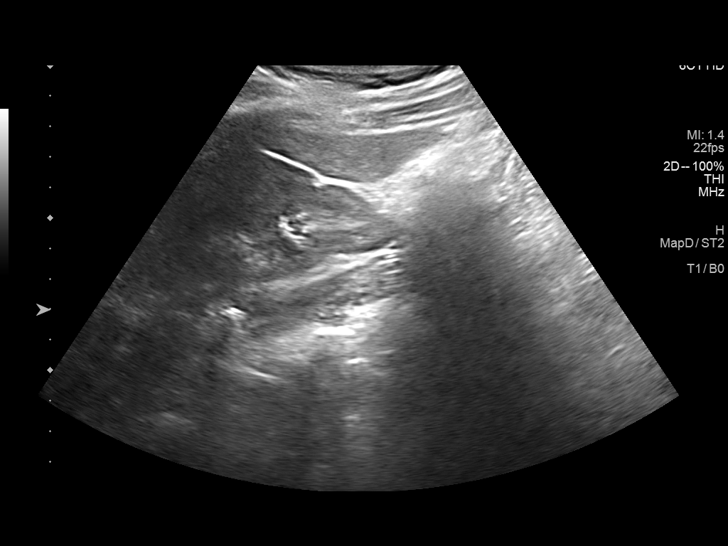
[im 13/34]
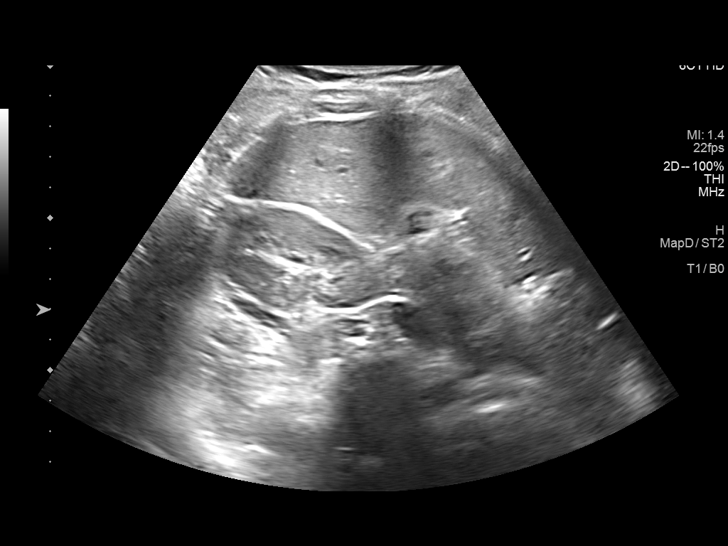
[im 16/34]
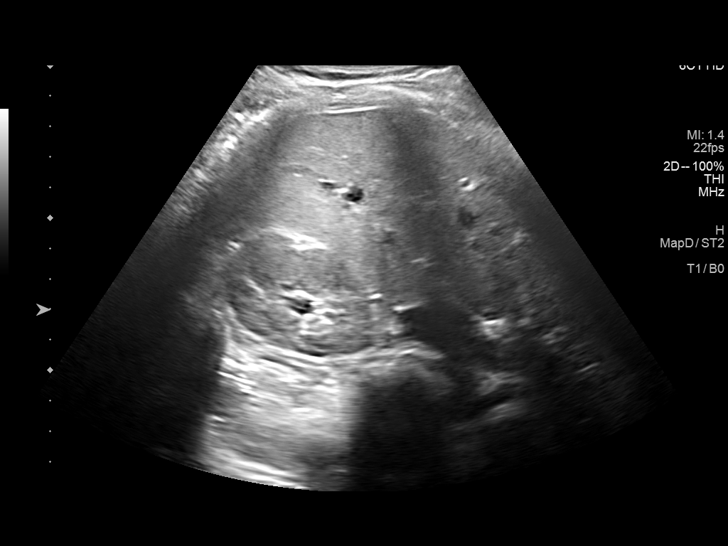
[im 18/34]
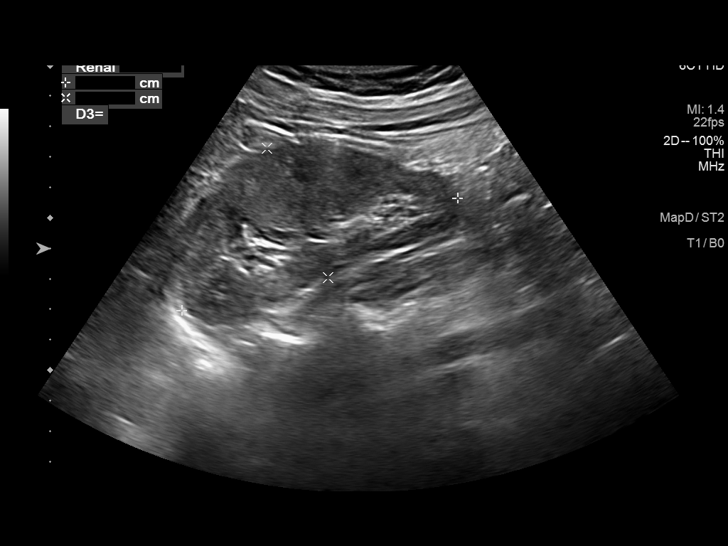
[im 21/34]
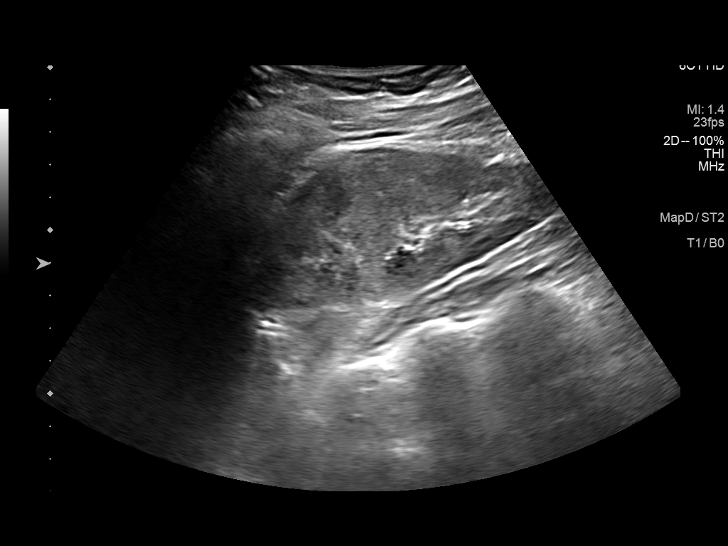
[im 23/34]
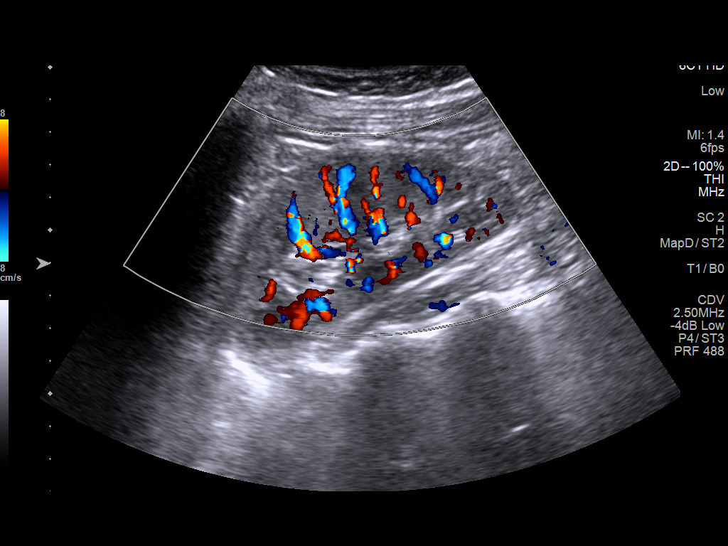
[im 25/34]
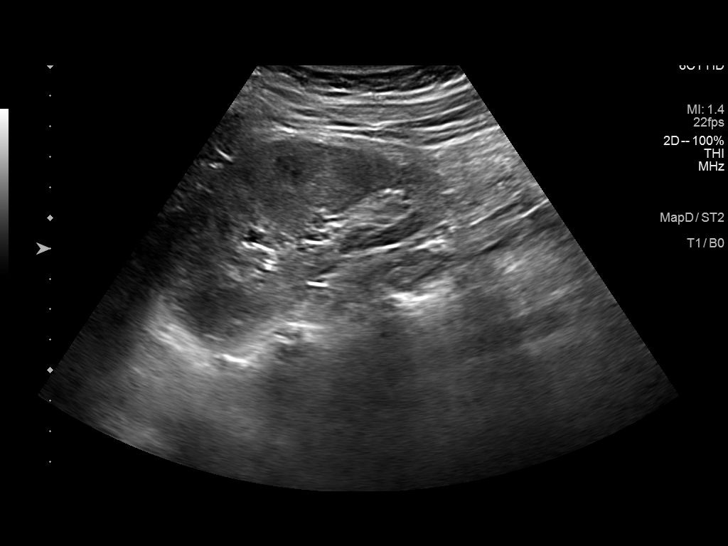
[im 28/34]
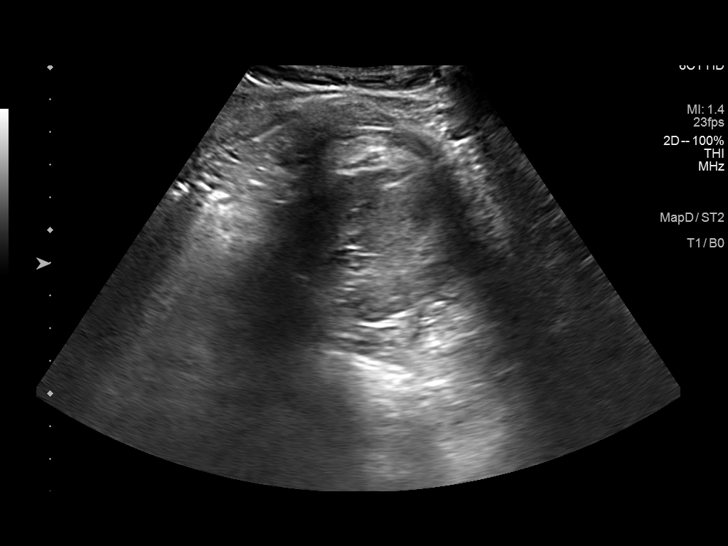
[im 31/34]
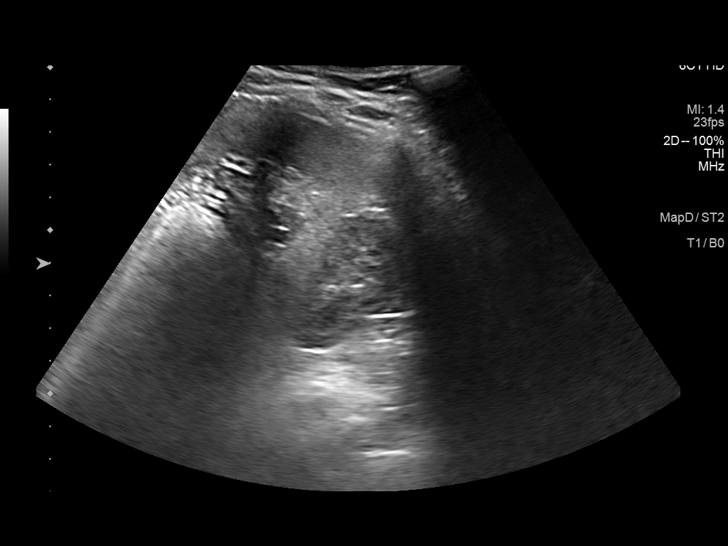
[im 34/34]
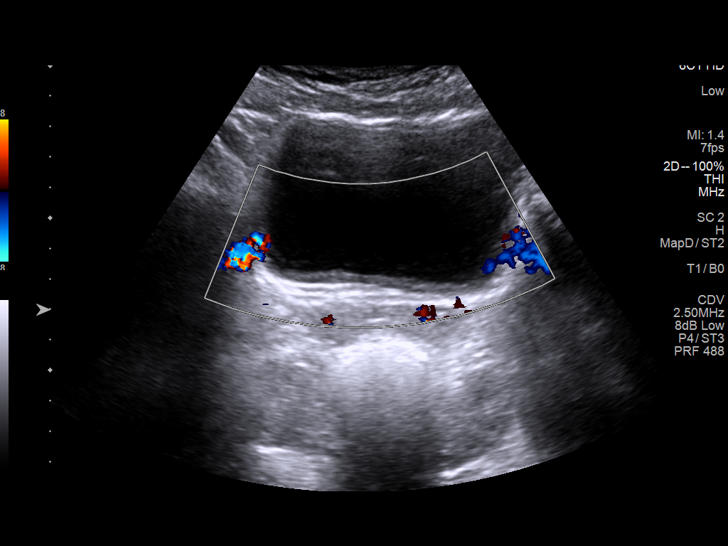

[14 of 25 positions shown; findings below may reference images not displayed]

FINDINGS: Right Kidney:

Renal measurements: 10.2 x 4.1 x 4.2 cm = volume: 92 mL.
Echogenicity within normal limits. No mass or hydronephrosis
visualized.

Left Kidney:

Renal measurements: 9.8 x 4.75.2 cm = volume: 126 mL. Echogenicity
within normal limits. No mass or hydronephrosis visualized.

Bladder:

Appears normal for degree of bladder distention.

Other:

None.
IMPRESSION: No hydronephrosis. Previously described nephrolithiasis are not
visualized sonographically.
